# Patient Record
Sex: Female | Born: 1955 | Race: White | Hispanic: No | Marital: Married | Smoking: Never smoker
Health system: Southern US, Community
[De-identification: ages and names within clinical notes are randomized; demographics above are authoritative.]

## PROBLEM LIST (undated history)

## (undated) DIAGNOSIS — C55 Malignant neoplasm of uterus, part unspecified: Secondary | ICD-10-CM

## (undated) DIAGNOSIS — J309 Allergic rhinitis, unspecified: Secondary | ICD-10-CM

## (undated) DIAGNOSIS — C50919 Malignant neoplasm of unspecified site of unspecified female breast: Secondary | ICD-10-CM

## (undated) DIAGNOSIS — Z9889 Other specified postprocedural states: Secondary | ICD-10-CM

## (undated) DIAGNOSIS — Z1501 Genetic susceptibility to malignant neoplasm of breast: Secondary | ICD-10-CM

## (undated) DIAGNOSIS — I890 Lymphedema, not elsewhere classified: Secondary | ICD-10-CM

## (undated) DIAGNOSIS — J45909 Unspecified asthma, uncomplicated: Secondary | ICD-10-CM

## (undated) DIAGNOSIS — O009 Unspecified ectopic pregnancy without intrauterine pregnancy: Secondary | ICD-10-CM

## (undated) DIAGNOSIS — O020 Blighted ovum and nonhydatidiform mole: Secondary | ICD-10-CM

## (undated) DIAGNOSIS — R112 Nausea with vomiting, unspecified: Secondary | ICD-10-CM

## (undated) DIAGNOSIS — C20 Malignant neoplasm of rectum: Secondary | ICD-10-CM

## (undated) HISTORY — DX: Allergic rhinitis, unspecified: J30.9

## (undated) HISTORY — PX: ECTOPIC PREGNANCY SURGERY: SHX613

## (undated) HISTORY — DX: Malignant neoplasm of uterus, part unspecified: C55

## (undated) HISTORY — PX: VESICOVAGINAL FISTULA CLOSURE W/ TAH: SUR271

## (undated) HISTORY — PX: TONSILLECTOMY: SUR1361

## (undated) HISTORY — DX: Malignant neoplasm of unspecified site of unspecified female breast: C50.919

## (undated) HISTORY — DX: Unspecified asthma, uncomplicated: J45.909

## (undated) HISTORY — DX: Lymphedema, not elsewhere classified: I89.0

## (undated) HISTORY — PX: TOTAL ABDOMINAL HYSTERECTOMY: SHX209

## (undated) HISTORY — DX: Malignant neoplasm of uterus, part unspecified (CMS-HCC): C55

## (undated) HISTORY — PX: OTHER PROCEDURE: U1053

## (undated) HISTORY — DX: Unspecified ectopic pregnancy without intrauterine pregnancy: O00.90

## (undated) HISTORY — DX: Blighted ovum and nonhydatidiform mole: O02.0

## (undated) HISTORY — DX: Other specified postprocedural states: R11.2

## (undated) HISTORY — PX: TONSILLECTOMY AND ADENOIDECTOMY: SHX2683

## (undated) HISTORY — PX: BREAST SURGERY: SHX581

## (undated) HISTORY — DX: Malignant neoplasm of unspecified site of unspecified female breast (CMS-HCC): C50.919

## (undated) HISTORY — DX: Other specified postprocedural states: Z98.890

## (undated) MED ORDER — ALBUTEROL SULFATE 108 (90 BASE) MCG/ACT IN AERS
INHALATION_SPRAY | RESPIRATORY_TRACT | Status: AC
Start: 2019-03-04 — End: ?

## (undated) MED ORDER — OMEPRAZOLE 20 MG OR CPDR
DELAYED_RELEASE_CAPSULE | ORAL | 1 refills | Status: AC
Start: 2018-09-18 — End: ?

## (undated) MED ORDER — IBUPROFEN 800 MG OR TABS
800.00 mg | ORAL_TABLET | Freq: Four times a day (QID) | ORAL | Status: AC | PRN
Start: 2018-05-01 — End: ?

---
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
## 1982-05-28 HISTORY — PX: BREAST LUMPECTOMY: SHX2

## 1993-05-28 HISTORY — PX: BREAST SURGERY: SHX581

## 1993-05-28 HISTORY — PX: MASTECTOMY: SHX3

## 1996-05-28 HISTORY — PX: TOTAL ABDOMINAL HYSTERECTOMY: SHX209

## 1996-05-28 HISTORY — PX: TOTAL ABDOMINAL HYSTERECTOMY W/ BILATERAL SALPINGOOPHORECTOMY: SHX83

## 2000-07-12 ENCOUNTER — Other Ambulatory Visit: Admission: RE | Admit: 2000-07-12 | Discharge: 2000-07-12 | Payer: Self-pay | Admitting: *Deleted

## 2000-10-08 ENCOUNTER — Ambulatory Visit (HOSPITAL_COMMUNITY): Admission: RE | Admit: 2000-10-08 | Discharge: 2000-10-08 | Payer: Self-pay | Admitting: Oncology

## 2000-10-08 ENCOUNTER — Encounter: Payer: Self-pay | Admitting: Oncology

## 2001-04-07 ENCOUNTER — Encounter: Admission: RE | Admit: 2001-04-07 | Discharge: 2001-04-07 | Payer: Self-pay | Admitting: General Surgery

## 2001-04-17 ENCOUNTER — Ambulatory Visit (HOSPITAL_COMMUNITY): Admission: RE | Admit: 2001-04-17 | Discharge: 2001-04-17 | Payer: Self-pay | Admitting: General Surgery

## 2001-04-17 ENCOUNTER — Encounter: Payer: Self-pay | Admitting: General Surgery

## 2001-06-19 ENCOUNTER — Other Ambulatory Visit: Admission: RE | Admit: 2001-06-19 | Discharge: 2001-06-19 | Payer: Self-pay | Admitting: *Deleted

## 2002-06-29 ENCOUNTER — Other Ambulatory Visit: Admission: RE | Admit: 2002-06-29 | Discharge: 2002-06-29 | Payer: Self-pay | Admitting: *Deleted

## 2004-05-02 ENCOUNTER — Ambulatory Visit: Payer: Self-pay | Admitting: Oncology

## 2004-10-31 ENCOUNTER — Ambulatory Visit: Payer: Self-pay | Admitting: Oncology

## 2004-12-11 ENCOUNTER — Ambulatory Visit: Payer: Self-pay | Admitting: Oncology

## 2005-06-08 ENCOUNTER — Ambulatory Visit: Payer: Self-pay | Admitting: Oncology

## 2005-06-19 ENCOUNTER — Encounter (INDEPENDENT_AMBULATORY_CARE_PROVIDER_SITE_OTHER): Payer: Self-pay | Admitting: *Deleted

## 2005-06-19 ENCOUNTER — Encounter: Admission: RE | Admit: 2005-06-19 | Discharge: 2005-06-19 | Payer: Self-pay | Admitting: General Surgery

## 2005-07-16 ENCOUNTER — Encounter: Admission: RE | Admit: 2005-07-16 | Discharge: 2005-07-16 | Payer: Self-pay | Admitting: General Surgery

## 2005-07-16 ENCOUNTER — Encounter (INDEPENDENT_AMBULATORY_CARE_PROVIDER_SITE_OTHER): Payer: Self-pay | Admitting: *Deleted

## 2005-07-16 ENCOUNTER — Ambulatory Visit (HOSPITAL_BASED_OUTPATIENT_CLINIC_OR_DEPARTMENT_OTHER): Admission: RE | Admit: 2005-07-16 | Discharge: 2005-07-16 | Payer: Self-pay | Admitting: General Surgery

## 2005-12-18 ENCOUNTER — Encounter: Admission: RE | Admit: 2005-12-18 | Discharge: 2005-12-18 | Payer: Self-pay | Admitting: Oncology

## 2007-09-26 ENCOUNTER — Ambulatory Visit: Payer: Self-pay | Admitting: Internal Medicine

## 2007-09-26 DIAGNOSIS — J45909 Unspecified asthma, uncomplicated: Secondary | ICD-10-CM | POA: Insufficient documentation

## 2007-09-26 DIAGNOSIS — Z8542 Personal history of malignant neoplasm of other parts of uterus: Secondary | ICD-10-CM | POA: Insufficient documentation

## 2007-09-26 DIAGNOSIS — C50919 Malignant neoplasm of unspecified site of unspecified female breast: Secondary | ICD-10-CM | POA: Insufficient documentation

## 2007-11-03 ENCOUNTER — Ambulatory Visit: Payer: Self-pay | Admitting: Internal Medicine

## 2008-03-30 ENCOUNTER — Telehealth (INDEPENDENT_AMBULATORY_CARE_PROVIDER_SITE_OTHER): Payer: Self-pay | Admitting: *Deleted

## 2008-04-29 ENCOUNTER — Ambulatory Visit: Payer: Self-pay | Admitting: Internal Medicine

## 2008-04-29 DIAGNOSIS — R609 Edema, unspecified: Secondary | ICD-10-CM | POA: Insufficient documentation

## 2008-10-04 ENCOUNTER — Telehealth (INDEPENDENT_AMBULATORY_CARE_PROVIDER_SITE_OTHER): Payer: Self-pay | Admitting: *Deleted

## 2008-12-01 ENCOUNTER — Ambulatory Visit: Payer: Self-pay | Admitting: Internal Medicine

## 2009-09-09 ENCOUNTER — Ambulatory Visit: Payer: Self-pay | Admitting: Internal Medicine

## 2009-09-09 DIAGNOSIS — J310 Chronic rhinitis: Secondary | ICD-10-CM | POA: Insufficient documentation

## 2009-09-15 ENCOUNTER — Telehealth (INDEPENDENT_AMBULATORY_CARE_PROVIDER_SITE_OTHER): Payer: Self-pay | Admitting: *Deleted

## 2009-09-16 ENCOUNTER — Encounter: Admission: RE | Admit: 2009-09-16 | Discharge: 2009-09-16 | Payer: Self-pay | Admitting: Internal Medicine

## 2010-01-11 ENCOUNTER — Encounter (INDEPENDENT_AMBULATORY_CARE_PROVIDER_SITE_OTHER): Payer: Self-pay | Admitting: *Deleted

## 2010-02-09 ENCOUNTER — Encounter (INDEPENDENT_AMBULATORY_CARE_PROVIDER_SITE_OTHER): Payer: Self-pay | Admitting: *Deleted

## 2010-02-13 ENCOUNTER — Ambulatory Visit: Payer: Self-pay | Admitting: Internal Medicine

## 2010-03-03 ENCOUNTER — Telehealth (INDEPENDENT_AMBULATORY_CARE_PROVIDER_SITE_OTHER): Payer: Self-pay | Admitting: *Deleted

## 2010-03-07 ENCOUNTER — Ambulatory Visit: Payer: Self-pay | Admitting: Internal Medicine

## 2010-03-09 ENCOUNTER — Encounter: Payer: Self-pay | Admitting: Internal Medicine

## 2010-04-07 ENCOUNTER — Ambulatory Visit: Payer: Self-pay | Admitting: Oncology

## 2010-06-27 NOTE — Miscellaneous (Signed)
Summary: previsit prep/rm  Clinical Lists Changes  Medications: Added new medication of MOVIPREP 100 GM  SOLR (PEG-KCL-NACL-NASULF-NA ASC-C) As per prep instructions. - Signed Rx of MOVIPREP 100 GM  SOLR (PEG-KCL-NACL-NASULF-NA ASC-C) As per prep instructions.;  #1 x 0;  Signed;  Entered by: Sherren Kerns RN;  Authorized by: Hilarie Fredrickson MD;  Method used: [facility] to [facility] [phone]*, [address] [place], [place], [place]  [address], Ph: [phone] or [phone], Fax: [phone] Observations: Added new observation of ALLERGY REV: Done (02/13/2010 12:37)    Prescriptions: MOVIPREP 100 GM  SOLR (PEG-KCL-NACL-NASULF-NA ASC-C) As per prep instructions.  #1 x 0   Entered by:   Sherren Kerns RN   Authorized by:   Hilarie Fredrickson MD   Signed by:   Sherren Kerns RN on 02/13/2010   Method used:   Electronically to        [facility] [phone]* (retail)       [address]       [place], [place]  [address]       Ph: [phone] or [phone]       Fax: [phone]   RxID:   [id number]

## 2010-06-27 NOTE — Assessment & Plan Note (Signed)
Summary: Pulmonary/ flare with rhinitis vs sinusitis   Primary Provider/Referring Provider:  Belva Agee  CC:  Acute visit.  Pt c/o wheezing , nasal congestion , and wagtery eyes and increased SOB x 4 wks.  She was txed with zpack and already finished this about 2 wks ago. Marland Kitchen  History of Present Illness: 1 yowf never smoked allergies and asthma more sinus than lower resp symptoms changed for the worse sev months ago after rx with acutane ( whereas prev on albuterol tablets and intal and based on prev allergy instructions avoid of animals and cig smoke and doing "fine").  Then in March 09 more wheezing assoc with runny nose and sneezing rx with benadryl  and got steroid shot no benefit then steroid pak that helped but worse when finished. worse with advair and no better with allegra or zyrtec.  Seen May 1 rx symbicort and as needed chlortrimeton and 100% back to nl.   11/03/07 doing fine @   80 two bid and rec step down after 3 mo  April 29, 2008 reduced dose on her own  to one twice daily win several weeks of ov and has had to use albuterol pills 2-3 times since then once in response to cat but overall doing fine. tors.    Main co is feeling puffy in ext at end of day, admits not careful with salt, wonders if it's the ics.  rx was symbicort 80 did not do well on qvar  December 01, 2008 ov for refill of symbicort 80 uses 2 qam and none at night, doing great no cough or sob. rec f/u as needed  September 09, 2009 Acute visit.  Pt c/o wheezing , nasal congestion , watery eyes and increased SOB x 4 wks.  She was txed with zpack and already finished this about 2 wks ago.  increased need for ventolin but not while sleeping and wakes up stuffy nose but no purulent secrections.  Pt denies any significant sore throat, dysphagia, itching, sneezing,    fever, chills, sweats, unintended wt loss, pleuritic or exertional cp, hempoptysis, change in activity tolerance  orthopnea pnd or leg swelling. Pt also denies any  obvious fluctuation in symptoms with weather or environmental change or other alleviating or aggravating factors.           Current Medications (verified): 1)  Symbicort 80-4.5 Mcg/act  Aero (Budesonide-Formoterol Fumarate) .... 2 Puffs First Thing  in Am and 2 Puffs Again in Pm About 12 Hours Later 2)  Ventolin Hfa 108 (90 Base) Mcg/act  Aers (Albuterol Sulfate) .Marland Kitchen.. 1-2 Puffs Every 4-6 Hours As Needed 3)  Flexeril 10 Mg  Tabs (Cyclobenzaprine Hcl) .... As Needed 4)  Maxzide-25 37.5-25 Mg Tabs (Triamterene-Hctz) .... One Daily As Needed For Swelling 5)  Diphenhydramine Hcl 50 Mg Caps (Diphenhydramine Hcl) .Marland Kitchen.. 1 Every 6 Hours As Needed 6)  Visine-A 0.025-0.3 % Soln (Naphazoline-Pheniramine) .... As Directed  Allergies (verified): 1)  ! Morphine 2)  Codeine  Past History:  Past Medical History: Asthma and allergies    - HFA 75% December 01, 2008  Breast Cancer, 1984 limited surgery, recurrence 1995 mastectomy and chemo no rt Uterine Cancer hysterectomy  Chronic rhinitis     - CRF reviewed September 09, 2009   Vital Signs:  Patient profile:   55 year old female O2 Sat:      99 % on Room air Temp:     98.2 degrees F oral Pulse rate:  89 / minute BP sitting:   120 / 92  (left arm)  Vitals Entered By: Vernie Murders (September 09, 2009 10:44 AM)  O2 Flow:  Room air  Physical Exam  Additional Exam:  pleasant ambulatory  white female in no acute distress. HEENT: nl dentition,  and orophanx. Severe bilateral turbinate edema/erythema. Nl external ear canals without cough reflex Neck without JVD/Nodes/TM Lungs clear to A and P bilaterally without cough on insp or exp maneuvers. RRR no s3 or murmur or increase in P2  trace edema both ankles Abd soft and benign with nl excursion in the supine position. No bruits or organomegaly Ext warm without calf tenderness, cyanosis clubbing  Skin warm and dry without lesions     Impression & Recommendations:  Problem # 1:  ASTHMA  (ICD-493.90)  DDX of  difficult airways managment all start with A and  include Adherence, Ace Inhibitors, Acid Reflux, Active Sinus Disease, Alpha 1 Antitripsin deficiency, Anxiety masquerading as Airways dz,  ABPA,  allergy(esp in young), Aspiration (esp in elderly), Adverse effects of DPI,  Active smokers, plus one B  = Beta blocker use..    Adherence seems quite good,  hfa technique acceptable  Active sinus dz appears to be the problem here, if not improving on rx consider sinus ct   Each maintenance medication was reviewed in detail including most importantly the difference between maintenance and as needed and under what circumstances the prns are to be used. See instructions for specific recommendations   I discussed the importance of understanding the goals of asthma therapy including the rule of 2's as it applies to the use of a rescue inhaler.   Problem # 2:  CHRONIC RHINITIS (ICD-472.0)  I reviewed both graphic and text formatted material regarding the diagnosis and management of rhinitis including how to use topical steroids effectively and why it is necessary to treat nasal obstruction and inflammation and infection allat  the same time to eradicate the cycle of inflammation causing obstruction causing an infection causing inflammation   Orders: Est. Patient Level IV ([address])  Medications Added to Medication List This Visit: 1)  Diphenhydramine Hcl 50 Mg Caps (Diphenhydramine hcl) .Marland Kitchen.. 1 every 6 hours as needed 2)  Visine-a 0.025-0.3 % Soln (Naphazoline-pheniramine) .... As directed 3)  Prednisone 10 Mg Tabs (Prednisone) .... 4 each am x 2days, 2x2days, 1x2days and stop 4)  Augmentin 875-125 Mg Tabs (Amoxicillin-pot clavulanate) .... By mouth twice daily  Patient Instructions: 1)  pseudophed for congestion 2)  Prednisone x 6 days 3)  Augmentin 875 twice daily x 10days 4)  Work on perfecting inhaler technique:  relax and blow all the way out then take a nice smooth deep  breath back in, triggering the inhaler at same time you start breathing in and hold few secs and out thru nose 5)  Return if not 100% back to normal Prescriptions: AUGMENTIN 875-125 MG  TABS (AMOXICILLIN-POT CLAVULANATE) By mouth twice daily  #20 x 0   Entered and Authorized by:   Nyoka Cowden MD   Signed by:   Nyoka Cowden MD on 09/09/2009   Method used:   Electronically to        [facility] [phone]* (retail)       [address]       [place], [place]  [address]       Ph: [phone] or [phone]  Fax: [phone]   RxID:   [id number] PREDNISONE 10 MG  TABS (PREDNISONE) 4 each am x 2days, 2x2days, 1x2days and stop  #14 x 0   Entered and Authorized by:   Nyoka Cowden MD   Signed by:   Nyoka Cowden MD on 09/09/2009   Method used:   Electronically to        [facility] [phone]* (retail)       [address]       [place], [place]  [address]       Ph: [phone] or [phone]       Fax: [phone]   RxID:   [id number]

## 2010-06-27 NOTE — Procedures (Signed)
Summary: Colonoscopy  Patient: Terri Daniels Note: All result statuses are Final unless otherwise noted.  Tests: (1) Colonoscopy (COL)   COL Colonoscopy           DONE     [facility]     [address]     [place], [place]  [address]           COLONOSCOPY PROCEDURE REPORT           PATIENT:  Terri Daniels, Terri Daniels  MR#:  [id number]     BIRTHDATE:  14-Dec-1955, 54 yrs. old  GENDER:  female     ENDOSCOPIST:  Wilhemina Bonito. Eda Keys, MD     REF. BY:  Belva Agee, NP     PROCEDURE DATE:  03/07/2010     PROCEDURE:  Colonoscopy with snare polypectomy x 5     ASA CLASS:  Class II     INDICATIONS:  Routine Risk Screening     MEDICATIONS:   Fentanyl 100 mcg IV, Versed 10 mg IV, Benadryl 50     mg IV           DESCRIPTION OF PROCEDURE:   After the risks benefits and     alternatives of the procedure were thoroughly explained, informed     consent was obtained.  Digital rectal exam was performed and     revealed no abnormalities.   The LB CF-[id number] endoscope     was introduced through the anus and advanced to the cecum, which     was identified by both the appendix and ileocecal valve, without     limitations.Time to cecum = 5:17 min. The quality of the prep was     excellent, using MoviPrep.  The instrument was then slowly     withdrawn (time = 22:35 min) as the colon was fully examined.     <<PROCEDUREIMAGES>>           FINDINGS:  Five polyps were found, all < 5 mm. these were located     in the cecum, ascending (3), and tansverse colon. Polyps were     snared without cautery. Retrieval was successful.   Moderate     diverticulosis was found in the left colon.  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   Retroflexed views in the rectum     revealed no abnormalities.    The scope was then withdrawn from     the patient and the procedure completed.           COMPLICATIONS:  None     ENDOSCOPIC IMPRESSION:     1) Five polyps - removed     2) Moderate diverticulosis in  the left colon     3) Melanosis throughout the colon           RECOMMENDATIONS:     1) Follow up colonoscopy in 3 or 5 years (pending pathology     results)           ______________________________     Wilhemina Bonito. Eda Keys, MD           CC:  Belva Agee, NP;  The Patient           n.     eSIGNED:   John N. Eda Keys at 03/07/2010 12:12 PM           [place], [place], [phone]  Note: An exclamation mark (!) indicates a result that was not dispersed into  the flowsheet. Document Creation Date: 03/07/2010 12:12 PM _______________________________________________________________________  (1) Order result status: Final Collection or observation date-time: 03/07/2010 12:01 Requested date-time:  Receipt date-time:  Reported date-time:  Referring Physician:   Ordering Physician: Fransico Setters [phone]) Specimen Source:  Source: Launa Grill Order Number: [id number] Lab site:   Appended Document: Colonoscopy     Procedures Next Due Date:    Colonoscopy: 02/2013

## 2010-06-27 NOTE — Progress Notes (Signed)
Summary: prep ?'s  Phone Note Call from Patient Call back at Home Phone [phone]   Caller: Patient Call For: Dr. Marina Goodell Reason for Call: Talk to Nurse Summary of Call: prep ?'s Initial call taken by: Vallarie Mare,  March 03, 2010 12:28 PM  Follow-up for Phone Call        Answered questions re: foods allowed Follow-up by: Wyona Almas RN,  March 03, 2010 1:38 PM

## 2010-06-27 NOTE — Letter (Signed)
Summary: Patient Notice- Polyp Results  [facility]  [address] [place], [place] [address]   Phone: [phone]  Fax: [phone]        March 09, 2010 MRN: [id number]    [facility] [address] [place], [place]  [address]    Dear Ms. Dresser,  I am pleased to inform you that the colon polyp(s) removed during your recent colonoscopy was (were) found to be benign (no cancer detected) upon pathologic examination.  I recommend you have a repeat colonoscopy examination in 3 years to look for recurrent polyps, as having colon polyps increases your risk for having recurrent polyps or even colon cancer in the future.  Should you develop new or worsening symptoms of abdominal pain, bowel habit changes or bleeding from the rectum or bowels, please schedule an evaluation with either your primary care physician or with me.     Additional information/recommendations:  __ No further action with gastroenterology is needed at this time. Please      follow-up with your primary care physician for your other healthcare      needs.    Please call us if you are having persistent problems or have questions about your condition that have not been fully answered at this time.  Sincerely,  Hilarie Fredrickson MD  This letter has been electronically signed by your physician.  Appended Document: Patient Notice- Polyp Results letter mailed

## 2010-06-27 NOTE — Letter (Signed)
Summary: [facility] Instructions  [facility]  [address] [place], [place] [address]   Phone: [phone]  Fax: [phone]       Terri Daniels    1955/11/29    MRN: [id number]        Procedure Day /Date:  Tuesday 03/07/2010     Arrival Time: 9:30 am     Procedure Time: 10:30 am     Location of Procedure:                    _x _  [facility] (4th Floor)                        PREPARATION FOR COLONOSCOPY WITH MOVIPREP   Starting 5 days prior to your procedure Thursday 10/6 do not eat nuts, seeds, popcorn, corn, beans, peas,  salads, or any raw vegetables.  Do not take any fiber supplements (e.g. Metamucil, Citrucel, and Benefiber).  THE DAY BEFORE YOUR PROCEDURE         DATE: Monday 10/10  1.  Drink clear liquids the entire day-NO SOLID FOOD  2.  Do not drink anything colored red or purple.  Avoid juices with pulp.  No orange juice.  3.  Drink at least 64 oz. (8 glasses) of fluid/clear liquids during the day to prevent dehydration and help the prep work efficiently.  CLEAR LIQUIDS INCLUDE: Water Jello Ice Popsicles Tea (sugar ok, no milk/cream) Powdered fruit flavored drinks Coffee (sugar ok, no milk/cream) Gatorade Juice: apple, white grape, white cranberry  Lemonade Clear bullion, consomm, broth Carbonated beverages (any kind) Strained chicken noodle soup Hard Candy                             4.  In the morning, mix first dose of MoviPrep solution:    Empty 1 Pouch A and 1 Pouch B into the disposable container    Add lukewarm drinking water to the top line of the container. Mix to dissolve    Refrigerate (mixed solution should be used within 24 hrs)  5.  Begin drinking the prep at 5:00 p.m. The MoviPrep container is divided by 4 marks.   Every 15 minutes drink the solution down to the next mark (approximately 8 oz) until the full liter is complete.   6.  Follow completed prep with 16 oz of clear liquid of your choice  (Nothing red or purple).  Continue to drink clear liquids until bedtime.  7.  Before going to bed, mix second dose of MoviPrep solution:    Empty 1 Pouch A and 1 Pouch B into the disposable container    Add lukewarm drinking water to the top line of the container. Mix to dissolve    Refrigerate  THE DAY OF YOUR PROCEDURE      DATE: Tuesday 10/11  Beginning at 5:30 a.m. (5 hours before procedure):         1. Every 15 minutes, drink the solution down to the next mark (approx 8 oz) until the full liter is complete.  2. Follow completed prep with 16 oz. of clear liquid of your choice.    3. You may drink clear liquids until 8:30 am (2 HOURS BEFORE PROCEDURE).   MEDICATION INSTRUCTIONS  Unless otherwise instructed, you should take regular prescription medications with a small sip of water   as early as possible the morning of your  procedure.    Additional medication instructions: n/a         OTHER INSTRUCTIONS  You will need a responsible adult at least 55 years of age to accompany you and drive you home.   This person must remain in the waiting room during your procedure.  Wear loose fitting clothing that is easily removed.  Leave jewelry and other valuables at home.  However, you may wish to bring a book to read or  an iPod/MP3 player to listen to music as you wait for your procedure to start.  Remove all body piercing jewelry and leave at home.  Total time from sign-in until discharge is approximately 2-3 hours.  You should go home directly after your procedure and rest.  You can resume normal activities the  day after your procedure.  The day of your procedure you should not:   Drive   Make legal decisions   Operate machinery   Drink alcohol   Return to work  You will receive specific instructions about eating, activities and medications before you leave.    The above instructions have been reviewed and explained to me by  Sherren Kerns RN  February 13, 2010 1:07 PM     I fully understand and can verbalize these instructions _____________________________ Date _________

## 2010-06-27 NOTE — Letter (Signed)
Summary: Pre Visit Letter Revised  [facility]  [address] [place], [place] [address]   Phone: [phone]  Fax: [phone]    01/11/2010 MRN: [id number]  Terri Daniels [address] [place], [place]  [address]              Procedure Date:  03/07/2010   Welcome to the Gastroenterology Division at [facility].    You are scheduled to see a nurse for your pre-procedure visit on 02/13/2010 at 1:00PM on the 3rd floor at [facility], [address]  We ask that you try to arrive at our office 15 minutes prior to your appointment time to allow for check-in.  Please take a minute to review the attached form.  If you answer "Yes" to one or more of the questions on the first page, we ask that you call the person listed at your earliest opportunity.  If you answer "No" to all of the questions, please complete the rest of the form and bring it to your appointment.    Your nurse visit will consist of discussing your medical and surgical history, your immediate family medical history, and your medications.    If you are unable to list all of your medications on the form, please bring the medication bottles to your appointment and we will list them.  We will need to be aware of both prescribed and over the counter drugs.  We will need to know exact dosage information as well.    Please be prepared to read and sign documents such as consent forms, a financial agreement, and acknowledgement forms.  If necessary, and with your consent, a friend or relative is welcome to sit-in on the nurse visit with you.  Please bring your insurance card so that we may make a copy of it.  If your insurance requires a referral to see a specialist, please bring your referral form from your primary care physician.  No co-pay is required for this nurse visit.     If you cannot keep your appointment, please call [phone] to cancel or reschedule prior to your appointment date.  This  allows [place] the opportunity to schedule an appointment for another patient in need of care.   Thank you for choosing [facility] for your medical needs.  We appreciate the opportunity to care for you.  Please visit [place] at our website  to learn more about our practice.                     Sincerely,  The Gastroenterology Division

## 2010-06-27 NOTE — Progress Notes (Signed)
Summary: still sick > needs sinus ct asap  Phone Note Call from Patient Call back at Home Phone [phone] Call back at [phone]   Caller: Patient Call For: wert Reason for Call: Talk to Nurse Summary of Call: drainage,sore around eye socket left side, voice not back to normal yet.  Still thinks there is something going on.  Antibiotic done in 3 days, finished prednisone.  Please advise. Initial call taken by: Eugene Gavia,  September 15, 2009 2:33 PM  Follow-up for Phone Call        pt is requesting to go ahead with CT of sinus, she states she has improvement in her symptoms, but still feels pressure inher ears and sinuses. Sh ehas 3 days left of abx. I advised her to finishe abx and I will forward message to MW to get recs on ct. please advise. Carron Curie CMA  September 15, 2009 2:45 PM yes, CT needed, placed order today for asap but if condition worsens over weekend will need to go to ER  as our office will be closed Follow-up by: Nyoka Cowden MD,  September 15, 2009 4:18 PM  Additional Follow-up for Phone Call Additional follow up Details #1::        Spoke with pt and advised that sinus ct ordered and advised if worsens needs to go to ER. Pt verbalized understanding. Additional Follow-up by: Vernie Murders,  September 15, 2009 4:27 PM

## 2010-08-31 ENCOUNTER — Telehealth: Payer: Self-pay | Admitting: Internal Medicine

## 2010-08-31 MED ORDER — BUDESONIDE-FORMOTEROL FUMARATE 80-4.5 MCG/ACT IN AERO: 2.0000 | INHALATION_SPRAY | Freq: Two times a day (BID) | RESPIRATORY_TRACT | Status: DC

## 2010-08-31 NOTE — Telephone Encounter (Signed)
Rx was refilled x 1 only- LMOM for pt to be aware that this was done and advised needs to keep appt for future rx.

## 2010-09-19 ENCOUNTER — Ambulatory Visit: Payer: Self-pay | Admitting: Internal Medicine

## 2010-09-25 ENCOUNTER — Encounter: Payer: Self-pay | Admitting: Internal Medicine

## 2010-09-26 ENCOUNTER — Encounter: Payer: Self-pay | Admitting: Internal Medicine

## 2010-09-26 ENCOUNTER — Ambulatory Visit (INDEPENDENT_AMBULATORY_CARE_PROVIDER_SITE_OTHER): Payer: BC Managed Care – PPO | Admitting: Internal Medicine

## 2010-09-26 DIAGNOSIS — J45909 Unspecified asthma, uncomplicated: Secondary | ICD-10-CM

## 2010-09-26 MED ORDER — BUDESONIDE-FORMOTEROL FUMARATE 80-4.5 MCG/ACT IN AERO: INHALATION_SPRAY | RESPIRATORY_TRACT | Status: DC

## 2010-09-26 MED ORDER — ALBUTEROL SULFATE HFA 108 (90 BASE) MCG/ACT IN AERS: 2.0000 | INHALATION_SPRAY | Freq: Four times a day (QID) | RESPIRATORY_TRACT | Status: DC | PRN

## 2010-09-26 NOTE — Patient Instructions (Signed)
If your breathing worsens or you need to use your rescue inhaler more than twice weekly or wake up more than twice a month with any respiratory symptoms or require more than two rescue inhalers per year, we need to see you right away because this means we're not controlling the underlying problem (inflammation) adequately.  Rescue inhalers do not control inflammation and overuse can lead to unnecessary and costly consequences.  They can make you feel better temporarily but eventually they will quit working effectively much as sleep aids lead to more insomnia if used regularly.    If you are satisfied with your treatment plan let your doctor know and he/she can either refill your medications or you can return here when your prescription runs out.     If in any way you are not 100% satisfied,  please tell us.  If 100% better, tell your friends!

## 2010-09-26 NOTE — Assessment & Plan Note (Signed)
All goals of chronic asthma control met including optimal function and elimination of symptoms with minimal need for rescue therapy.  Contingencies discussed in full including contacting this office immediately if not controlling the symptoms using the rule of two's.     See instructions for specific recommendations which were reviewed directly with the patient who was given a copy with highlighter outlining the key components.   The proper method of use, as well as anticipated side effects, of this metered-dose inhaler are discussed and demonstrated to the patient. Improved to 75% p coaching

## 2010-09-26 NOTE — Progress Notes (Signed)
  Subjective:    Patient ID: Terri Daniels, female    DOB: 12/02/1955, 55 y.o.   MRN: [id number]  HPI   Primary Provider/Referring Provider: Belva Agee   54 yowf never smoked allergies and asthma more sinus than lower resp symptoms changed for the worse sev months ago after rx with acutane ( whereas prev on albuterol tablets and intal and based on prev allergy instructions avoid of animals and cig smoke and doing "fine"). Then in March 09 more wheezing assoc with runny nose and sneezing rx with benadryl and got steroid shot no benefit then steroid pak that helped but worse when finished. worse with advair and no better with allegra or zyrtec. Seen May 1 rx symbicort and as needed chlortrimeton and 100% back to nl.    April 29, 2008 reduced dose on her own to one twice daily win several weeks of ov and has had to use albuterol pills 2-3 times since then once in response to cat but overall doing fine. tors.  Main co is feeling puffy in ext at end of day, admits not careful with salt, wonders if it's the ics.  rx was symbicort 80 did not do well on qvar   December 01, 2008 ov for refill of symbicort 80 uses 2 qam and none at night, doing great no cough or sob. rec f/u as needed  September 09, 2009 Acute visit. Pt c/o wheezing , nasal congestion , watery eyes and increased SOB x 4 wks. She was txed with zpack and already finished this about 2 wks ago. increased need for ventolin but not while sleeping and wakes up stuffy nose but no purulent secrections.   09/26/2010  Ov/Marcia Lepera cc no cough or sob on symbicort 80 2 puffs each am with rare use of ventolin and rarely uses the second dose of symbicort.  Sleeping ok without nocturnal  or early am exac of resp c/o's or need for noct saba.  Pt denies any significant sore throat, dysphagia, itching, sneezing,  nasal congestion or excess/ purulent secretions,  fever, chills, sweats, unintended wt loss, pleuritic or exertional cp, hempoptysis, orthopnea pnd or leg  swelling.    Also denies any obvious fluctuation of symptoms with weather or environmental changes or other aggravating or alleviating factors.      Past Medical History:  Asthma and allergies  - HFA 75% December 01, 2008 > 90% 09/26/2010  Breast Cancer, 1984 limited surgery, recurrence 1995 mastectomy and chemo no rt  Uterine Cancer > hysterectomy  Chronic rhinitis  - CRF reviewed September 09, 2009              Review of Systems     Objective:   Physical Exam  pleasant ambulatory white female in no acute distress.   HEENT: nl dentition, and orophanx. Severe bilateral turbinate edema/erythema. Nl external ear canals without cough reflex  Neck without JVD/Nodes/TM  Lungs clear to A and P bilaterally without cough on insp or exp maneuvers.  RRR no s3 or murmur or increase in P2 trace edema both ankles  Abd soft and benign with nl excursion in the supine position. No bruits or organomegaly  Ext warm without calf tenderness, cyanosis clubbing  Skin warm and dry without lesions        Assessment & Plan:

## 2010-10-13 NOTE — Op Note (Signed)
Terri Daniels, Terri Daniels             ACCOUNT NO.:  [id number]   MEDICAL RECORD NO.:  [id number]          PATIENT TYPE:  AMB   LOCATION:  [facility]                          FACILITY:  [facility]   PHYSICIAN:  Ollen Gross. Vernell Morgans, M.D. DATE OF BIRTH:  1955-08-17   DATE OF PROCEDURE:  07/16/2005  DATE OF DISCHARGE:  07/16/2005                                 OPERATIVE REPORT   PREOPERATIVE DIAGNOSIS:  Left breast calcifications with history of right  breast cancer.   POSTOP DIAGNOSIS:  Left breast calcifications with history of right breast  cancer.   PROCEDURES:  Left breast needle-localized breast biopsy.   SURGEON:  Dr. Carolynne Edouard.   ANESTHESIA:  General via LMA.   PROCEDURE:  After informed consent was obtained, the patient was brought to  the operating placed in supine position operating table. After adequate  induction of general anesthesia, the patient's left breast was prepped with  Betadine and draped in usual sterile manner.  The wire was entering the  breast laterally. The patient had a previous left breast reduction. She had  a circumareolar scar and the lateral portion of the scar was reopened  sharply with the 15 blade knife.  This incision was carried down through the  skin and into the subcutaneous tissue and fatty tissue of the breast.  The  path of the wire was able to be palpated and a circular mass of tissue  around the path of the wire was taken down sharply with the electrocautery  in a circumferential manner. Once the tissue was taken out around the  circumference of the wire. The mass was oriented with a long stitch being  lateral and short stitch being superior.  A specimen x-ray was taken which  showed the calcifications and clip to be within the specimen. The specimen  sent to pathology for further evaluation. The wound was then irrigated  with  copious amounts of saline and hemostasis was achieved using Bovie  electrocautery. The deep layer of the incision was closed with  interrupted 3-  0 Vicryl stitches and the skin was closed a running 4-0 Monocryl  subcuticular stitch. Benzoin, Steri-Strips and sterile dressings were  applied. The patient tolerated well.  At end of the case all needle, sponge,  instrument counts correct. The patient was then awakened taken recovery room  in stable condition.      Ollen Gross. Vernell Morgans, M.D.  Electronically Signed     PST/MEDQ  D:  07/22/2005  T:  07/23/2005  Job:  [id number]

## 2011-05-17 ENCOUNTER — Encounter: Payer: Self-pay | Admitting: Oncology

## 2011-10-24 ENCOUNTER — Other Ambulatory Visit: Payer: Self-pay | Admitting: Internal Medicine

## 2011-10-24 ENCOUNTER — Telehealth: Payer: Self-pay | Admitting: Internal Medicine

## 2011-10-24 MED ORDER — BUDESONIDE-FORMOTEROL FUMARATE 80-4.5 MCG/ACT IN AERO: INHALATION_SPRAY | RESPIRATORY_TRACT | Status: DC

## 2011-10-24 NOTE — Telephone Encounter (Signed)
If you are satisfied with your treatment plan let your doctor know and he/she can either refill your medications or you can return here when your prescription runs out.   I spoke with pt and she scheduled an apt to see MW 11/20/11 at 10:00 for f/u. Rx has been sent to the pharmacy

## 2011-11-20 ENCOUNTER — Ambulatory Visit: Payer: BC Managed Care – PPO | Admitting: Internal Medicine

## 2011-11-21 IMAGING — CT CT PARANASAL SINUSES LIMITED
1 series · 10 of 12 positions shown, 13 images · non-contrast
Comparison: None.

CLINICAL DATA: Sinus pain.  Chronic rhinitis.  Pain in the face.
Medial left orbital soreness.  Left ear pain.

CT PARANASAL SINUS LIMITED WITHOUT CONTRAST
TECHNIQUE: Multidetector CT images of the paranasal sinuses were
obtained in a single plane without contrast.

[Series 3: coronal soft · axial · 0.31mm/px · z∈[+37,+127]mm · 10 of 12 slices shown, 13 images]
[im 2/12  brain]
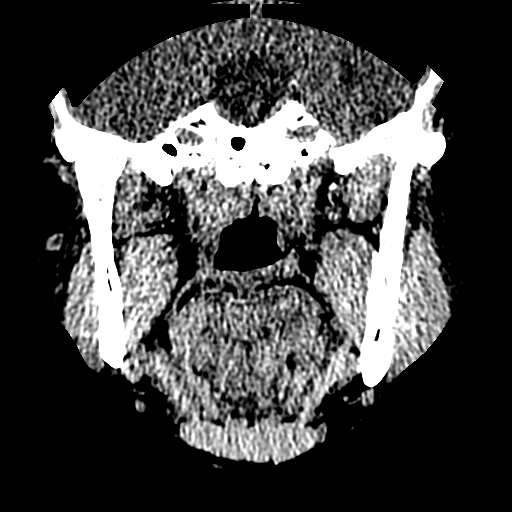
[im 2/12  bone]
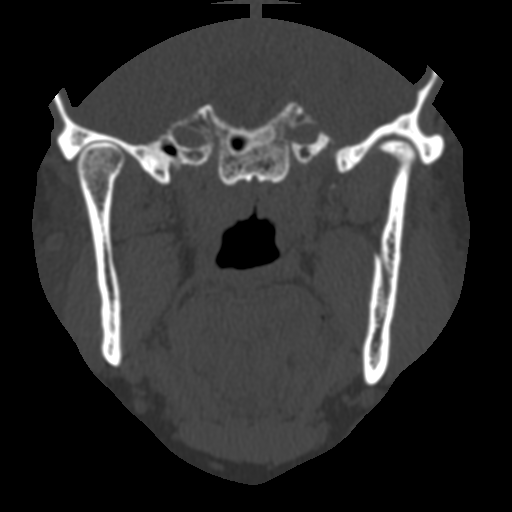
[im 3/12  bone]
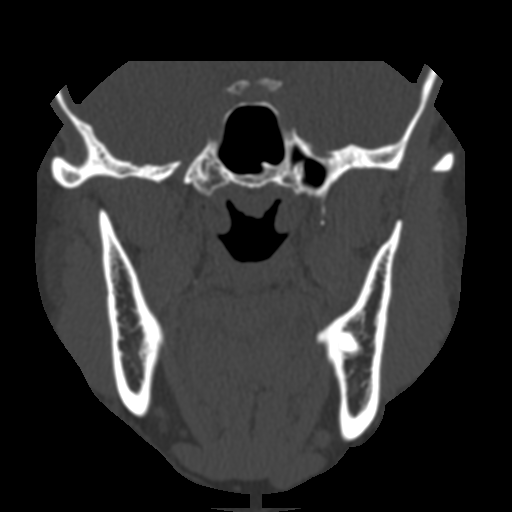
[im 4/12  bone]
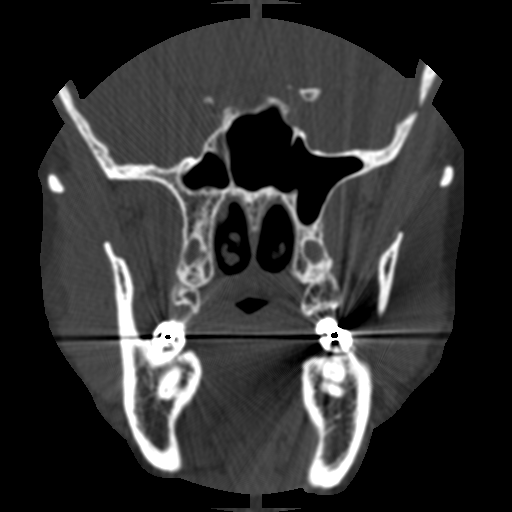
[im 5/12  bone]
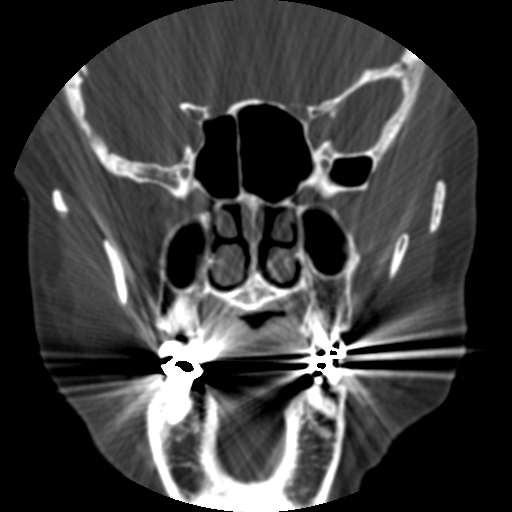
[im 6/12  brain]
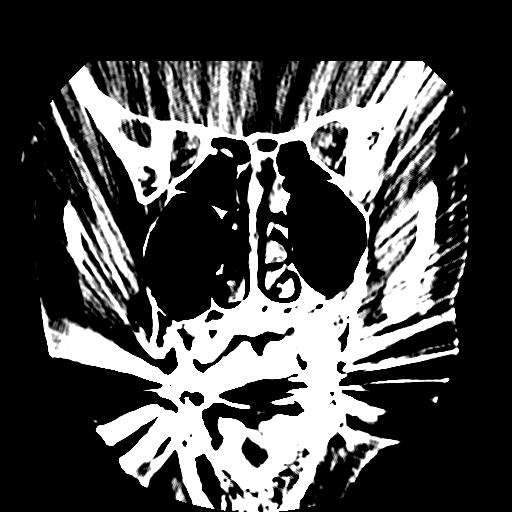
[im 6/12  bone]
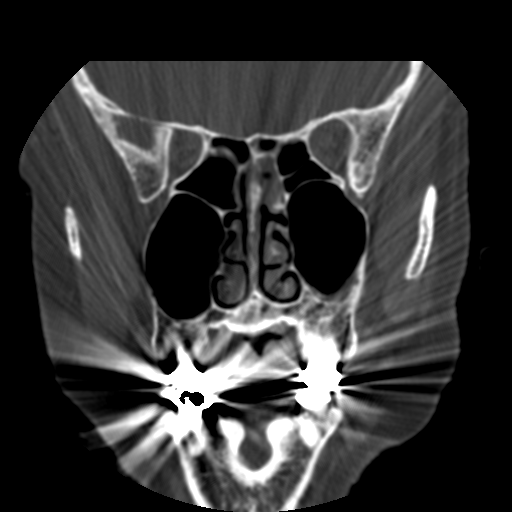
[im 7/12  bone]
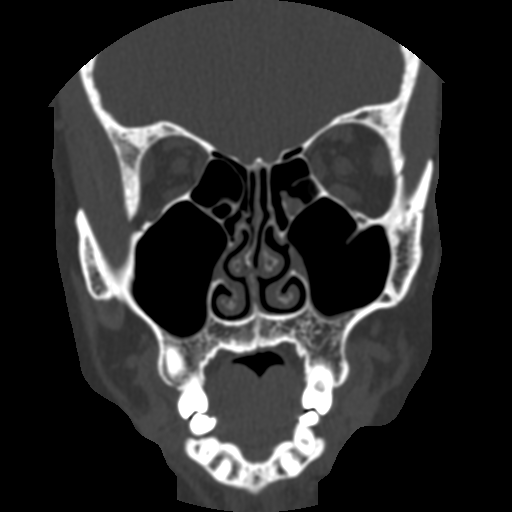
[im 8/12  bone]
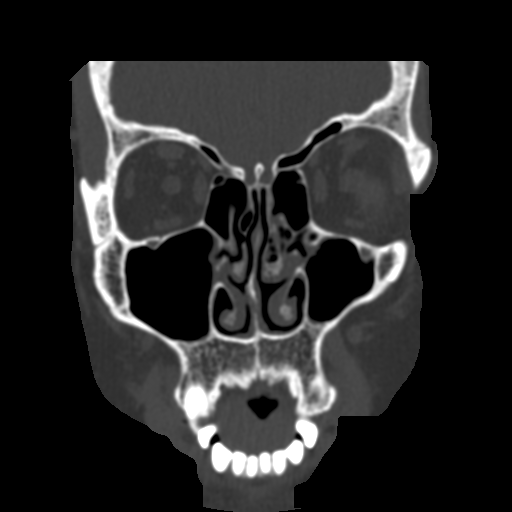
[im 9/12  bone]
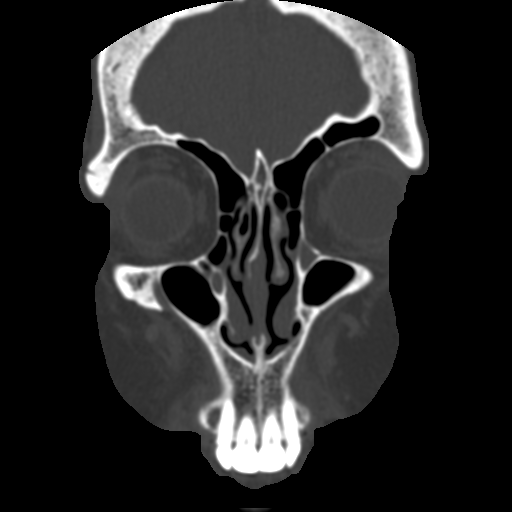
[im 10/12  brain]
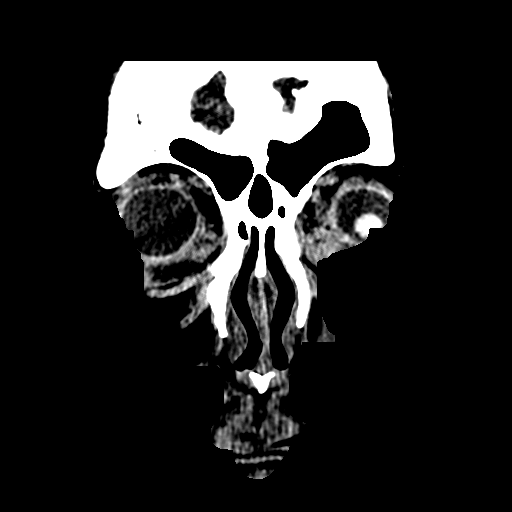
[im 10/12  bone]
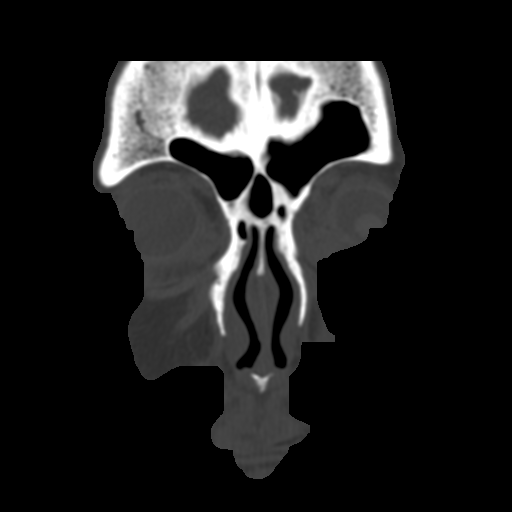
[im 11/12  bone]
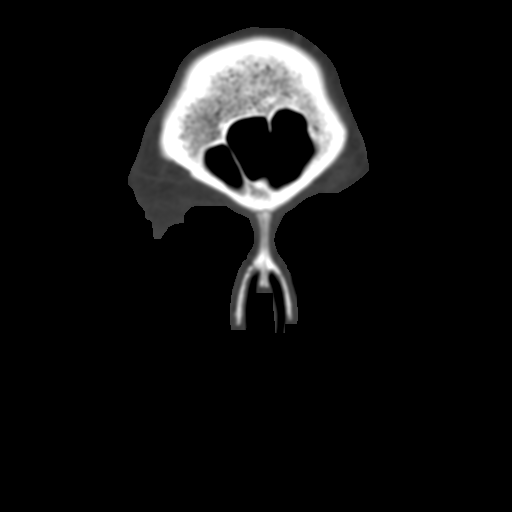

[10 of 12 positions shown; findings below may reference images not displayed]

FINDINGS: There is minimal bilateral maxillary sinus mucosal
thickening.  There is mucosal thickening adjacent to the maxillary
sinus ostia.  Ethmoid sinus mucosal thickening on the right and
mild right-sided sphenoid sinus mucosal thickening.  Frontal sinus
normally aerated.  No air-fluid levels.  Bony sinus walls are
intact. Nasal cavity unremarkable.
IMPRESSION: Negative for acute sinusitis.

Minimal maxillary sinus and ethmoid and sphenoid sinus mucosal
disease.

## 2011-12-20 ENCOUNTER — Ambulatory Visit (INDEPENDENT_AMBULATORY_CARE_PROVIDER_SITE_OTHER): Payer: BC Managed Care – PPO | Admitting: Internal Medicine

## 2011-12-20 ENCOUNTER — Encounter: Payer: Self-pay | Admitting: Internal Medicine

## 2011-12-20 VITALS — BP 130/86 | HR 85 | Temp 98.8°F

## 2011-12-20 DIAGNOSIS — J45909 Unspecified asthma, uncomplicated: Secondary | ICD-10-CM

## 2011-12-20 MED ORDER — BUDESONIDE-FORMOTEROL FUMARATE 80-4.5 MCG/ACT IN AERO: INHALATION_SPRAY | RESPIRATORY_TRACT | Status: DC

## 2011-12-20 NOTE — Progress Notes (Signed)
Subjective:    Patient ID: Terri Daniels, female    DOB: [birth year]    MRN: [id number]  HPI   Primary Provider/Referring Provider: Belva Agee   56 yowf never smoked allergies and asthma more sinus than lower resp symptoms changed for the worse sev months ago after rx with acutane ( whereas prev on albuterol tablets and intal and based on prev allergy instructions avoid of animals and cig smoke and doing "fine"). Then in March 09 more wheezing assoc with runny nose and sneezing rx with benadryl and got steroid shot no benefit then steroid pak that helped but worse when finished. worse with advair and no better with allegra or zyrtec. Seen May 1 rx symbicort and as needed chlortrimeton and 100% back to nl.    April 29, 2008 reduced dose on her own to one twice daily win several weeks of ov and has had to use albuterol pills 2-3 times since then once in response to cat but overall doing fine. tors.  Main co is feeling puffy in ext at end of day, admits not careful with salt, wonders if it's the ics.  rx was symbicort 80 2bid as did not do well on qvar    09/26/2010  Ov/[name] cc no cough or sob on symbicort 80 2 puffs each am with rare use of ventolin and rarely uses the second dose of symbicort.  Sleeping ok without nocturnal  or early am exac of resp c/o's or need for noct saba. rec Rule of 2s reviewed  F/u prn for refills     12/20/2011 f/u ov/[name] maintaining symbicort 80 2bid with good control until 2 weeks prior to OV when needed increase rescue for a few weeks in setting of uri with prominent nasal symptoms and now back normal= not limited by sob, no need for daytime saba,  No unusual cough, purulent sputum or sinus/hb symptoms on present rx.  Sleeping ok without nocturnal  or early am exacerbation  of respiratory  c/o's or need for noct saba. Also denies any obvious fluctuation of symptoms with weather or environmental changes or other aggravating or alleviating factors except as  outlined above   ROS  The following are not active complaints unless bolded sore throat, dysphagia, dental problems, itching, sneezing,  nasal congestion or excess/ purulent secretions, ear ache,   fever, chills, sweats, unintended wt loss, pleuritic or exertional cp, hemoptysis,  orthopnea pnd or leg swelling, presyncope, palpitations, heartburn, abdominal pain, anorexia, nausea, vomiting, diarrhea  or change in bowel or urinary habits, change in stools or urine, dysuria,hematuria,  rash, arthralgias, visual complaints, headache, numbness weakness or ataxia or problems with walking or coordination,  change in mood/affect or memory.        Past Medical History:  Asthma and allergies  - HFA 75% December 01, 2008 > 90% 09/26/2010  Breast Cancer, 1984 limited surgery, recurrence 1995 mastectomy and chemo no rt  Uterine Cancer > hysterectomy  Chronic rhinitis  - CRF reviewed September 09, 2009                   Objective:   Physical Exam  pleasant ambulatory white female in no acute distress.   HEENT: nl dentition, and orophanx. Severe bilateral turbinate edema/erythema. Nl external ear canals without cough reflex  Neck without JVD/Nodes/TM  Lungs clear to A and P bilaterally without cough on insp or exp maneuvers.  RRR no s3 or murmur or increase in P2 trace edema both ankles  Abd soft  and benign with nl excursion in the supine position. No bruits or organomegaly  Ext warm without calf tenderness, cyanosis clubbing  Skin warm and dry without lesions        Assessment & Plan:

## 2011-12-20 NOTE — Assessment & Plan Note (Signed)
I had an extended discussion with the patient today lasting 15 to 20 minutes of a 25 minute visit on the following issues:  All goals of chronic asthma control met including optimal function and elimination of symptoms with minimal need for rescue therapy.  Contingencies discussed in full including contacting this office immediately if not controlling the symptoms using the rule of two's.     She did great for 50 of the last 52 weeks and got thru a typical trigger with prn rescue and now back to baseline good control and absence of saba need.  The proper method of use, as well as anticipated side effects, of a metered-dose inhaler are discussed and demonstrated to the patient. Improved effectiveness after extensive coaching during this visit to a level of approximately  75%  F/u can be prn

## 2011-12-20 NOTE — Patient Instructions (Signed)
If you are satisfied with your treatment plan let your doctor know and he/she can either refill your medications or you can return here when your prescription runs out.     If in any way you are not 100% satisfied,  please tell us.  If 100% better, tell your friends!  

## 2012-01-25 ENCOUNTER — Other Ambulatory Visit: Payer: Self-pay | Admitting: Internal Medicine

## 2012-01-29 ENCOUNTER — Other Ambulatory Visit: Payer: Self-pay | Admitting: Internal Medicine

## 2012-09-18 ENCOUNTER — Other Ambulatory Visit: Payer: Self-pay | Admitting: *Deleted

## 2012-09-18 MED ORDER — IBUPROFEN 800 MG PO TABS: 800.0000 mg | ORAL_TABLET | Freq: Three times a day (TID) | ORAL | Status: DC | PRN

## 2012-09-18 NOTE — Telephone Encounter (Signed)
LAST OV 10/13. LAST REFILL 03/10/12

## 2012-11-22 ENCOUNTER — Other Ambulatory Visit: Payer: Self-pay | Admitting: Nurse Practitioner

## 2012-11-24 NOTE — Telephone Encounter (Signed)
LAST OV 10/13 

## 2012-12-07 ENCOUNTER — Other Ambulatory Visit: Payer: Self-pay | Admitting: Internal Medicine

## 2012-12-23 ENCOUNTER — Other Ambulatory Visit: Payer: Self-pay | Admitting: Internal Medicine

## 2013-01-12 ENCOUNTER — Other Ambulatory Visit: Payer: Self-pay | Admitting: Internal Medicine

## 2013-01-13 ENCOUNTER — Telehealth: Payer: Self-pay | Admitting: Internal Medicine

## 2013-01-13 MED ORDER — BUDESONIDE-FORMOTEROL FUMARATE 80-4.5 MCG/ACT IN AERO: 2.0000 | INHALATION_SPRAY | Freq: Two times a day (BID) | RESPIRATORY_TRACT | Status: DC

## 2013-01-13 NOTE — Telephone Encounter (Signed)
Pt has not been seen since 11/2011. Advised her that she would need an appointment in order to receive further refills. Appointment has been made for 01/19/13 at 2:30 with MW. 30 day supply will be sent to her pharmacy.

## 2013-01-19 ENCOUNTER — Ambulatory Visit (INDEPENDENT_AMBULATORY_CARE_PROVIDER_SITE_OTHER): Payer: BC Managed Care – PPO | Admitting: Internal Medicine

## 2013-01-19 ENCOUNTER — Encounter: Payer: Self-pay | Admitting: Internal Medicine

## 2013-01-19 VITALS — BP 122/90 | HR 102 | Ht 69.0 in | Wt 176.8 lb

## 2013-01-19 DIAGNOSIS — J45909 Unspecified asthma, uncomplicated: Secondary | ICD-10-CM

## 2013-01-19 MED ORDER — ALBUTEROL SULFATE HFA 108 (90 BASE) MCG/ACT IN AERS: INHALATION_SPRAY | RESPIRATORY_TRACT | Status: AC

## 2013-01-19 MED ORDER — BUDESONIDE-FORMOTEROL FUMARATE 80-4.5 MCG/ACT IN AERO: 2.0000 | INHALATION_SPRAY | Freq: Two times a day (BID) | RESPIRATORY_TRACT | Status: DC

## 2013-01-19 NOTE — Progress Notes (Signed)
  Subjective:    Patient ID: Terri Daniels, female    DOB: 08-28-1955    MRN: [id number]      Primary Provider/Referring Provider: Belva Agee   Brief patient profile:  61  yowf never smoked allergies and asthma more sinus than lower resp symptoms  Then in March 09  . worse with advair and no better with allegra or zyrtec >   rx symbicort and as needed chlortrimeton and 100% back to nl since    HPI   09/26/2010  Ov/Wert cc no cough or sob on symbicort 80 2 puffs each am with rare use of ventolin and rarely uses the second dose of symbicort.  Sleeping ok without nocturnal  or early am exac of resp c/o's or need for noct saba. rec Rule of 2s reviewed  F/u prn for refills     12/20/2011 f/u ov/Wert maintaining symbicort 80 2bid with good control until 2 weeks prior to OV when needed increase rescue for a few weeks in setting of uri with prominent nasal symptoms and now back normal= not limited by sob, no need for daytime saba rec No change   01/19/2013 f/u ov/Wert f/u asthma  Chief Complaint  Patient presents with  . Follow-up    pt reports breathing is doing well-- denies any other concerns at this time  . Medication Refill    symbicort   no cough or limiting sob, no need for saba   Sleeping ok without nocturnal  or early am exacerbation  of respiratory  c/o's or need for noct saba. Also denies any obvious fluctuation of symptoms with weather or environmental changes or other aggravating or alleviating factors except as outlined above   ROS  The following are not active complaints unless bolded sore throat, dysphagia, dental problems, itching, sneezing,  nasal congestion or excess/ purulent secretions, ear ache,   fever, chills, sweats, unintended wt loss, pleuritic or exertional cp, hemoptysis,  orthopnea pnd or leg swelling, presyncope, palpitations, heartburn, abdominal pain, anorexia, nausea, vomiting, diarrhea  or change in bowel or urinary habits, change in stools or urine,  dysuria,hematuria,  rash, arthralgias, visual complaints, headache, numbness weakness or ataxia or problems with walking or coordination,  change in mood/affect or memory.        Past Medical History:  Asthma and allergies  - HFA 75% December 01, 2008 > 90% 09/26/2010  Breast Cancer, 1984 limited surgery, recurrence 1995 mastectomy and chemo no rt  Uterine Cancer > hysterectomy  Chronic rhinitis  - CRF reviewed September 09, 2009                   Objective:   Physical Exam  pleasant ambulatory white female in no acute distress.    Wt Readings from Last 3 Encounters:  01/19/13 176 lb 12.8 oz (80.196 kg)    HEENT: nl dentition, and orophanx. Severe bilateral turbinate edema/erythema. Nl external ear canals without cough reflex  Neck without JVD/Nodes/TM  Lungs clear to A and P bilaterally without cough on insp or exp maneuvers.  RRR no s3 or murmur or increase in P2 trace edema both ankles  Abd soft and benign with nl excursion in the supine position. No bruits or organomegaly  Ext warm without calf tenderness, cyanosis clubbing  Skin warm and dry without lesions        Assessment & Plan:

## 2013-01-19 NOTE — Patient Instructions (Addendum)
Symbicort 80 Take 2 puffs first thing in am and then another 2 puffs about 12 hours later.   Only use your albuterol as a rescue medication to be used if you can't catch your breath by resting or doing a relaxed purse lip breathing pattern. The less you use it, the better it will work when you need it. Ok to use it up to 2 puffs every 4 hours but goal is less than twice weekly  If your breathing worsens or you need to use your rescue inhaler more than twice weekly or wake up more than twice a month with any respiratory symptoms or require more than two rescue inhalers per year, we need to see you right away because this means we're not controlling the underlying problem (inflammation) adequately.  Rescue inhalers do not control inflammation and overuse can lead to unnecessary and costly consequences.  They can make you feel better temporarily but eventually they will quit working effectively much as sleep aids lead to more insomnia if used regularly.    If you are satisfied with your treatment plan let your doctor know and he/she can either refill your medications or you can return here when your prescription runs out.     If in any way you are not 100% satisfied,  please tell us.  If 100% better, tell your friends!

## 2013-01-20 NOTE — Assessment & Plan Note (Signed)
The proper method of use, as well as anticipated side effects, of a metered-dose inhaler are discussed and demonstrated to the patient. Improved effectiveness after extensive coaching during this visit to a level of approximately  90%  All goals of chronic asthma control met including optimal function and elimination of symptoms with minimal need for rescue therapy.  Contingencies discussed in full including contacting this office immediately if not controlling the symptoms using the rule of two's.     See instructions for specific recommendations which were reviewed directly with the patient who was given a copy with highlighter outlining the key components.  

## 2013-02-16 ENCOUNTER — Encounter: Payer: Self-pay | Admitting: Internal Medicine

## 2013-02-27 ENCOUNTER — Ambulatory Visit: Payer: BC Managed Care – PPO | Admitting: Internal Medicine

## 2013-03-16 ENCOUNTER — Encounter: Payer: Self-pay | Admitting: Internal Medicine

## 2013-04-01 ENCOUNTER — Ambulatory Visit (AMBULATORY_SURGERY_CENTER): Payer: Self-pay | Admitting: *Deleted

## 2013-04-01 VITALS — Ht 69.0 in | Wt 181.0 lb

## 2013-04-01 DIAGNOSIS — Z86010 Personal history of colonic polyps: Secondary | ICD-10-CM

## 2013-04-01 MED ORDER — MOVIPREP 100 G PO SOLR: ORAL | Status: DC

## 2013-04-01 NOTE — Progress Notes (Signed)
Patient denies any allergies to eggs or soy. Patient has post-op nausea, "they usually give me Zofran", per patient. Patient does not remember having any problems with previous colonoscopies in the past.

## 2013-04-02 ENCOUNTER — Encounter: Payer: Self-pay | Admitting: Internal Medicine

## 2013-04-20 ENCOUNTER — Other Ambulatory Visit: Payer: Self-pay | Admitting: Nurse Practitioner

## 2013-04-21 ENCOUNTER — Ambulatory Visit (AMBULATORY_SURGERY_CENTER): Payer: BC Managed Care – PPO | Admitting: Internal Medicine

## 2013-04-21 ENCOUNTER — Encounter: Payer: Self-pay | Admitting: Internal Medicine

## 2013-04-21 VITALS — BP 104/49 | HR 70 | Temp 97.6°F | Resp 13 | Ht 69.0 in | Wt 181.0 lb

## 2013-04-21 DIAGNOSIS — Z86010 Personal history of colonic polyps: Secondary | ICD-10-CM

## 2013-04-21 DIAGNOSIS — D126 Benign neoplasm of colon, unspecified: Secondary | ICD-10-CM

## 2013-04-21 MED ORDER — SODIUM CHLORIDE 0.9 % IV SOLN: 500.0000 mL | INTRAVENOUS | Status: DC

## 2013-04-21 NOTE — Patient Instructions (Signed)

## 2013-04-21 NOTE — Progress Notes (Signed)
Called to room to assist during endoscopic procedure.  Patient ID and intended procedure confirmed with present staff. Received instructions for my participation in the procedure from the performing physician.  

## 2013-04-21 NOTE — Progress Notes (Signed)
Lidocaine-40mg IV prior to Propofol InductionPropofol given over incremental dosages 

## 2013-04-21 NOTE — Op Note (Signed)
[facility] [address] [place], [address]   COLONOSCOPY PROCEDURE REPORT  PATIENT: Abbegale, Stehle  MR#: [id number] BIRTHDATE: August 16, 1955 , 57  yrs. old GENDER: Female ENDOSCOPIST: Roxy Cedar, MD REFERRED [id number] Program Recall PROCEDURE DATE:  04/21/2013 PROCEDURE:   Colonoscopy with snare polypectomy x 2 First Screening Colonoscopy - Avg.  risk and is 50 yrs.  old or older - No.  Prior Negative Screening - Now for repeat screening. N/A  History of Adenoma - Now for follow-up colonoscopy & has been > or = to 3 yrs.  Yes hx of adenoma.  Has been 3 or more years since last colonoscopy.  Polyps Removed Today? Yes. ASA CLASS:   Class II INDICATIONS:Patient's personal history of adenomatous colon polyps. Index exam October 2011 (multiple adenomatous polyps) MEDICATIONS: MAC sedation, administered by CRNA and propofol (Diprivan) 350mg  IV  DESCRIPTION OF PROCEDURE:   After the risks benefits and alternatives of the procedure were thoroughly explained, informed consent was obtained.  A digital rectal exam revealed no abnormalities of the rectum.   The LB [id number]  endoscope was introduced through the anus and advanced to the cecum, which was identified by both the appendix and ileocecal valve. No adverse events experienced.   The quality of the prep was good, using MoviPrep  The instrument was then slowly withdrawn as the colon was fully examined.  COLON FINDINGS: Two diminutive polyps were found in the ascending colon and transverse colon.  A polypectomy was performed with a cold snare.  The resection was complete and the polyp tissue was completely retrieved.   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   The colon mucosa was otherwise normal. Retroflexed views revealed no abnormalities. The time to cecum=3 minutes 57 seconds.  Withdrawal time=13 minutes 12 seconds.  The scope was withdrawn and the procedure  completed. COMPLICATIONS: There were no complications.  ENDOSCOPIC IMPRESSION: 1.   Two diminutive polyps were found in the colon; polypectomy was performed with a cold snare 2.   Mild melanosis was found throughout the entire examined colon 3.   The colon mucosa was otherwise normal  RECOMMENDATIONS: 1. Follow up colonoscopy in 5 years   eSigned:  Roxy Cedar, MD 04/21/2013 2:41 PM   cc: Rudi Heap, MD and The Patient

## 2013-04-21 NOTE — Progress Notes (Signed)
No complaints noted in the recovery room. Maw   

## 2013-04-22 ENCOUNTER — Telehealth: Payer: Self-pay | Admitting: *Deleted

## 2013-04-22 NOTE — Telephone Encounter (Signed)
  Follow up Call-  Call back number 04/21/2013  Post procedure Call Back phone  # [phone]  Permission to leave phone message Yes     Patient questions:  Do you have a fever, pain , or abdominal swelling? no Pain Score  0 *  Have you tolerated food without any problems? yes  Have you been able to return to your normal activities? yes  Do you have any questions about your discharge instructions: Diet   no Medications  no Follow up visit  no  Do you have questions or concerns about your Care? no  Actions: * If pain score is 4 or above: No action needed, pain <4.  Pt. Still sleep information obtained from spouse.

## 2013-04-28 ENCOUNTER — Encounter: Payer: Self-pay | Admitting: Internal Medicine

## 2013-08-12 ENCOUNTER — Other Ambulatory Visit: Payer: Self-pay | Admitting: Dermatology

## 2013-09-28 ENCOUNTER — Encounter: Payer: Self-pay | Admitting: Family Medicine

## 2013-09-28 ENCOUNTER — Ambulatory Visit (INDEPENDENT_AMBULATORY_CARE_PROVIDER_SITE_OTHER): Payer: BC Managed Care – PPO | Admitting: Family Medicine

## 2013-09-28 VITALS — BP 130/75 | HR 99 | Temp 98.9°F | Ht 69.0 in | Wt 182.0 lb

## 2013-09-28 DIAGNOSIS — R21 Rash and other nonspecific skin eruption: Secondary | ICD-10-CM

## 2013-09-28 MED ORDER — METHYLPREDNISOLONE ACETATE 80 MG/ML IJ SUSP
80.0000 mg | Freq: Once | INTRAMUSCULAR | Status: AC
  Administered 2013-09-28: 80 mg via INTRAMUSCULAR

## 2013-09-28 MED ORDER — HYDROXYZINE HCL 25 MG PO TABS: 25.0000 mg | ORAL_TABLET | Freq: Three times a day (TID) | ORAL | Status: DC | PRN

## 2013-09-28 NOTE — Progress Notes (Signed)
   Subjective:    Patient ID: Terri Daniels, female    DOB: 22-Nov-1955, 58 y.o.   MRN: [id number]  HPI  This 58 y.o. female presents for evaluation of dermatitis and itching.  Review of Systems No chest pain, SOB, HA, dizziness, vision change, N/V, diarrhea, constipation, dysuria, urinary urgency or frequency, myalgias, arthralgias or rash.     Objective:   Physical Exam  Rash on lower extremities and arms      Assessment & Plan:  Rash and nonspecific skin eruption - Plan: methylPREDNISolone acetate (DEPO-MEDROL) injection 80 mg, hydrOXYzine (ATARAX/VISTARIL) 25 MG tablet  Lysbeth Penner FNP

## 2013-10-22 ENCOUNTER — Telehealth: Payer: Self-pay | Admitting: Nurse Practitioner

## 2013-10-22 NOTE — Telephone Encounter (Signed)
appt given for Monday for a rash that has been going on for weeks

## 2013-10-26 ENCOUNTER — Ambulatory Visit (INDEPENDENT_AMBULATORY_CARE_PROVIDER_SITE_OTHER): Payer: BC Managed Care – PPO | Admitting: Nurse Practitioner

## 2013-10-26 ENCOUNTER — Encounter: Payer: Self-pay | Admitting: Nurse Practitioner

## 2013-10-26 VITALS — BP 124/77 | HR 86 | Temp 97.9°F | Ht 69.0 in | Wt 184.0 lb

## 2013-10-26 DIAGNOSIS — L2089 Other atopic dermatitis: Secondary | ICD-10-CM

## 2013-10-26 MED ORDER — FLUCONAZOLE 150 MG PO TABS: ORAL_TABLET | ORAL | Status: DC

## 2013-10-26 MED ORDER — PREDNISONE 20 MG PO TABS: ORAL_TABLET | ORAL | Status: DC

## 2013-10-26 MED ORDER — METHYLPREDNISOLONE ACETATE 80 MG/ML IJ SUSP
80.0000 mg | Freq: Once | INTRAMUSCULAR | Status: AC
  Administered 2013-10-26: 80 mg via INTRAMUSCULAR

## 2013-10-26 MED ORDER — SULFAMETHOXAZOLE-TMP DS 800-160 MG PO TABS: 1.0000 | ORAL_TABLET | Freq: Two times a day (BID) | ORAL | Status: DC

## 2013-10-26 NOTE — Progress Notes (Signed)
   Subjective:    Patient ID: Terri Daniels, female    DOB: 1956-03-14, 58 y.o.   MRN: [id number]  HPI Patient in today c/o rash that started in February- Started on her right hand just a small itchy spot.Saw her dermatologist and was dx with contact dermatiitis. Was treated with cream which did not help- SHe went back and was given keflex and that cleared it up for about 1 week. As soon as she came off of the keflex it started coming back and now it is all over. She flew to [facility] to see her mom and saw a Doctor out there and hey diagnosed her with folliculitis and was put on minocycline daily and is currently still taking. She has had a similar ash in the past and saw D.Steadman and she treated her with fluconazole and prednisone along with sulfa.    Review of Systems  Constitutional: Negative.   HENT: Negative.   Cardiovascular: Negative.   Gastrointestinal: Negative.   Genitourinary: Negative.   Neurological: Negative.   Psychiatric/Behavioral: Negative.   All other systems reviewed and are negative.      Objective:   Physical Exam  Constitutional: She is oriented to person, place, and time. She appears well-developed and well-nourished.  Cardiovascular: Normal rate, regular rhythm and normal heart sounds.   Pulmonary/Chest: Effort normal and breath sounds normal.  Neurological: She is alert and oriented to person, place, and time.  Skin: Skin is warm.  erythematus spots on hands, upper arms and legs, Raised varying in size and appear to have been scratched.   BP 124/77  Pulse 86  Temp(Src) 97.9 F (36.6 C) (Oral)  Ht 5\' 9"  (1.753 m)  Wt 184 lb (83.462 kg)  BMI 27.16 kg/m2        Assessment & Plan:  1. Papular atopic dermatitis Do  Not scratch or pick area Cool compresses if needed - methylPREDNISolone acetate (DEPO-MEDROL) injection 80 mg; Inject 1 mL (80 mg total) into the muscle once.  Meds ordered this encounter  Medications  . methylPREDNISolone acetate  (DEPO-MEDROL) injection 80 mg    Sig:   . sulfamethoxazole-trimethoprim (BACTRIM DS) 800-160 MG per tablet    Sig: Take 1 tablet by mouth 2 (two) times daily.    Dispense:  20 tablet    Refill:  0    Order Specific Question:  Supervising Provider    Answer:  Chipper Herb [[id number]]  . fluconazole (DIFLUCAN) 150 MG tablet    Sig: 1 po q week x 2 weeks    Dispense:  2 tablet    Refill:  0    Order Specific Question:  Supervising Provider    Answer:  Chipper Herb [[id number]]  . predniSONE (DELTASONE) 20 MG tablet    Sig: 2 po at sametime daily X 5 days- start Tuesday    Dispense:  10 tablet    Refill:  0    Order Specific Question:  Supervising Provider    Answer:  Chipper Herb [[id number]]   RO prn or if not improving  Mary-Margaret Hassell Done, FNP

## 2013-10-26 NOTE — Patient Instructions (Signed)
Stasis Dermatitis °Stasis dermatitis occurs when veins lose the ability to pump blood back to the heart (poor venous circulation). It causes a reddish-purple to brownish scaly, itchy rash on the legs. The rash comes from pooling of blood (stasis). °CAUSES  °This occurs because the veins do not work very well anymore or because pressure may be increased in the veins due to other conditions. With blood pooling, the increased pressure in the tiny blood vessels (capillaries) causes fluid to leak out of the capillaries into the tissue. The extra fluid makes it harder for the blood to feed the cells and get rid of waste products. °SYMPTOMS  °Stasis dermatitis appears as red, scaly, itchy patches on the legs. A yellowish or light brown discoloration is also present. Due to scratching or other injury, these patches can become an ulcer. This ulcer may remain for long periods of time. The ulcer can also become infected. Swelling of the legs is often present with stasis dermatitis. If the leg is swollen, this increases the risk of infection and further damage to the skin. Sometimes, intense itching, tingling, and burning occurs before signs of stasis dermatitis appear. You may find yourself scratching the insides of your ankles or rubbing your ankles together before the rash appears. After healing, there are often brown spots on the affected skin. °DIAGNOSIS  °Your caregiver makes this diagnosis based on an exam. Other tests may be done to better understand the cause. °TREATMENT  °If underlying conditions are present, they must be treated. Some of these conditions are heart failure, thyroid problems, poor nutrition, and varicose veins. °· Cortisone creams and ointments applied to the skin (topically) may be needed, as well as medicine to reduce swelling in the legs (diuretics). °· Compression stockings or an elastic wrap may also be needed to reduce swelling. °· If there is an infection, antibiotic medicines may also be  used. °HOME CARE INSTRUCTIONS  °· Try to rest and raise (elevate) the affected leg above the level of the heart, if possible. °· Burow's solution wet packs applied for 30 minutes, 3 times daily, will help the weepy rash. Stop using the packs before your skin gets too dry. You can also use a mixture of 3 parts white vinegar to 1 quart water. °· Grease your legs daily with ointments, such as petroleum jelly, to fight dryness. °· Avoid scratching or injuring the area. °SEEK IMMEDIATE MEDICAL CARE IF:  °· Your rash gets worse. °· An ulcer forms. °· You have an oral temperature above 102° F (38.9° C), not controlled by medicine. °· You have any other severe symptoms. °Document Released: 08/23/2005 Document Revised: 08/06/2011 Document Reviewed: 10/03/2009 °ExitCare® Patient Information ©2014 [employer]. ° °

## 2013-11-02 ENCOUNTER — Telehealth: Payer: Self-pay | Admitting: Nurse Practitioner

## 2013-11-02 NOTE — Telephone Encounter (Signed)
Only blood test we could do is blood culture and if it was in her blood she would be really sick with a fever. SH edoes not need blood culture

## 2013-11-02 NOTE — Telephone Encounter (Signed)
Should not need another antibiotic. NTBS

## 2013-11-02 NOTE — Telephone Encounter (Signed)
Patient really wants blood work to see if it is in her blood?

## 2013-11-03 NOTE — Telephone Encounter (Signed)
Patient aware she is still on the antibiotic. Told her is still stays in sytem for a couple of days after last dose to call back if it does not  get better.

## 2013-11-06 ENCOUNTER — Ambulatory Visit (INDEPENDENT_AMBULATORY_CARE_PROVIDER_SITE_OTHER): Payer: BC Managed Care – PPO | Admitting: Physician Assistant

## 2013-11-06 ENCOUNTER — Encounter: Payer: Self-pay | Admitting: Physician Assistant

## 2013-11-06 VITALS — BP 127/88 | HR 94 | Temp 99.5°F | Ht 69.0 in | Wt 183.4 lb

## 2013-11-06 DIAGNOSIS — L259 Unspecified contact dermatitis, unspecified cause: Secondary | ICD-10-CM

## 2013-11-06 DIAGNOSIS — T148XXA Other injury of unspecified body region, initial encounter: Secondary | ICD-10-CM

## 2013-11-06 DIAGNOSIS — L309 Dermatitis, unspecified: Secondary | ICD-10-CM

## 2013-11-06 DIAGNOSIS — L089 Local infection of the skin and subcutaneous tissue, unspecified: Secondary | ICD-10-CM

## 2013-11-06 LAB — POCT CBC
Granulocyte percent: 62.3 %G (ref 37–80)
HEMATOCRIT: 42.1 % (ref 37.7–47.9)
HEMOGLOBIN: 13.6 g/dL (ref 12.2–16.2)
Lymph, poc: 2 (ref 0.6–3.4)
MCH: 31.1 pg (ref 27–31.2)
MCHC: 32.3 g/dL (ref 31.8–35.4)
MCV: 96.1 fL (ref 80–97)
MPV: 6.8 fL (ref 0–99.8)
POC Granulocyte: 3.7 (ref 2–6.9)
POC LYMPH PERCENT: 34.3 %L (ref 10–50)
Platelet Count, POC: 339 10*3/uL (ref 142–424)
RBC: 4.4 M/uL (ref 4.04–5.48)
RDW, POC: 13.8 %
WBC: 5.9 10*3/uL (ref 4.6–10.2)

## 2013-11-06 NOTE — Progress Notes (Signed)
Subjective:     Patient ID: Terri Daniels, female   DOB: 10-Jul-1955, 58 y.o.   MRN: [id number]  HPI Pt with a long term rash now for several months She has seen mult providers to include Derm and family pract She has been on oral steroids, ATB, and topical meds Rash started as painful to the lower legs and hands  Review of Systems + pain + scale No drainage form the sites No hx of same    Objective:   Physical Exam Scaly erythem rash to the hands bilat No surrounding erythema or induration No ulceration Wound cult pending CBC and ASO titer also done    Assessment:     Dermatitis    Plan:     Keep area clean/dry Hold further tx for now F/U pending labs

## 2013-11-06 NOTE — Patient Instructions (Signed)
Eczema Eczema, also called atopic dermatitis, is a skin disorder that causes inflammation of the skin. It causes a red rash and dry, scaly skin. The skin becomes very itchy. Eczema is generally worse during the cooler winter months and often improves with the warmth of summer. Eczema usually starts showing signs in infancy. Some children outgrow eczema, but it may last through adulthood.  CAUSES  The exact cause of eczema is not known, but it appears to run in families. People with eczema often have a family history of eczema, allergies, asthma, or hay fever. Eczema is not contagious. Flare-ups of the condition may be caused by:   Contact with something you are sensitive or allergic to.   Stress. SIGNS AND SYMPTOMS  Dry, scaly skin.   Red, itchy rash.   Itchiness. This may occur before the skin rash and may be very intense.  DIAGNOSIS  The diagnosis of eczema is usually made based on symptoms and medical history. TREATMENT  Eczema cannot be cured, but symptoms usually can be controlled with treatment and other strategies. A treatment plan might include:  Controlling the itching and scratching.   Use over-the-counter antihistamines as directed for itching. This is especially useful at night when the itching tends to be worse.   Use over-the-counter steroid creams as directed for itching.   Avoid scratching. Scratching makes the rash and itching worse. It may also result in a skin infection (impetigo) due to a break in the skin caused by scratching.   Keeping the skin well moisturized with creams every day. This will seal in moisture and help prevent dryness. Lotions that contain alcohol and water should be avoided because they can dry the skin.   Limiting exposure to things that you are sensitive or allergic to (allergens).   Recognizing situations that cause stress.   Developing a plan to manage stress.  HOME CARE INSTRUCTIONS   Only take over-the-counter or  prescription medicines as directed by your health care provider.   Do not use anything on the skin without checking with your health care provider.   Keep baths or showers short (5 minutes) in warm (not hot) water. Use mild cleansers for bathing. These should be unscented. You may add nonperfumed bath oil to the bath water. It is best to avoid soap and bubble bath.   Immediately after a bath or shower, when the skin is still damp, apply a moisturizing ointment to the entire body. This ointment should be a petroleum ointment. This will seal in moisture and help prevent dryness. The thicker the ointment, the better. These should be unscented.   Keep fingernails cut short. Children with eczema may need to wear soft gloves or mittens at night after applying an ointment.   Dress in clothes made of cotton or cotton blends. Dress lightly, because heat increases itching.   A child with eczema should stay away from anyone with fever blisters or cold sores. The virus that causes fever blisters (herpes simplex) can cause a serious skin infection in children with eczema. SEEK MEDICAL CARE IF:   Your itching interferes with sleep.   Your rash gets worse or is not better within 1 week after starting treatment.   You see pus or soft yellow scabs in the rash area.   You have a fever.   You have a rash flare-up after contact with someone who has fever blisters.  Document Released: 05/11/2000 Document Revised: 03/04/2013 Document Reviewed: 12/15/2012 [facility] Patient Information 2014 [employer].

## 2013-11-07 LAB — ANTISTREPTOLYSIN O TITER: ASO: 35.3 IU/mL (ref 0.0–200.0)

## 2013-11-08 LAB — AEROBIC CULTURE

## 2013-11-09 ENCOUNTER — Telehealth: Payer: Self-pay | Admitting: Physician Assistant

## 2013-11-10 ENCOUNTER — Telehealth: Payer: Self-pay | Admitting: Physician Assistant

## 2013-11-10 NOTE — Telephone Encounter (Signed)
Pt wanted to be seen for rash appt scheduled

## 2013-11-11 NOTE — Telephone Encounter (Signed)
Message copied by Marin Olp on Wed Nov 11, 2013  4:33 PM ------      Message from: Lodema Pilot      Created: Wed Nov 11, 2013  1:03 PM       Pls inform all of the labs were nl      Celiac dz would need further testing       ------

## 2013-11-17 ENCOUNTER — Ambulatory Visit (INDEPENDENT_AMBULATORY_CARE_PROVIDER_SITE_OTHER): Payer: BC Managed Care – PPO | Admitting: Physician Assistant

## 2013-11-17 ENCOUNTER — Encounter: Payer: Self-pay | Admitting: Physician Assistant

## 2013-11-17 VITALS — BP 118/82 | HR 99 | Temp 98.3°F | Ht 69.0 in | Wt 183.0 lb

## 2013-11-17 DIAGNOSIS — L259 Unspecified contact dermatitis, unspecified cause: Secondary | ICD-10-CM

## 2013-11-17 DIAGNOSIS — L309 Dermatitis, unspecified: Secondary | ICD-10-CM

## 2013-11-17 MED ORDER — VALACYCLOVIR HCL 500 MG PO TABS: 500.0000 mg | ORAL_TABLET | Freq: Two times a day (BID) | ORAL | Status: DC

## 2013-11-17 NOTE — Progress Notes (Signed)
Subjective:     Patient ID: Terri Daniels, female   DOB: 01-Jan-1956, 58 y.o.   MRN: [id number]  HPI Pt here for review of her rash States sx have improved Improved since not doing the dishes Needs rf of Valtrex for cold sore  Review of Systems No pain or drainage from the sites on her hands    Objective:   Physical Exam Area to both hands almost completely healed No drainage or ulceration noted Still with a sl scale    Assessment:     Dermatitis    Plan:     Question if may be related to sensitivity to soap or latex gloves She will look for latex free glove Reviewed all nl results with pt RF of Valtrex done today F/U prn

## 2013-11-17 NOTE — Patient Instructions (Signed)

## 2013-12-15 ENCOUNTER — Telehealth: Payer: Self-pay | Admitting: Family Medicine

## 2013-12-15 NOTE — Telephone Encounter (Signed)
Appt given per patients request 

## 2013-12-16 ENCOUNTER — Ambulatory Visit (INDEPENDENT_AMBULATORY_CARE_PROVIDER_SITE_OTHER): Payer: BC Managed Care – PPO | Admitting: Family Medicine

## 2013-12-16 ENCOUNTER — Encounter: Payer: Self-pay | Admitting: Family Medicine

## 2013-12-16 VITALS — BP 125/80 | HR 89 | Temp 99.8°F | Ht 69.0 in | Wt 183.4 lb

## 2013-12-16 DIAGNOSIS — L259 Unspecified contact dermatitis, unspecified cause: Secondary | ICD-10-CM

## 2013-12-16 DIAGNOSIS — L309 Dermatitis, unspecified: Secondary | ICD-10-CM

## 2013-12-16 MED ORDER — ACYCLOVIR 400 MG PO TABS: ORAL_TABLET | ORAL | Status: DC

## 2013-12-16 NOTE — Progress Notes (Signed)
   Subjective:    Patient ID: Terri Daniels, female    DOB: 1955-08-16, 58 y.o.   MRN: [id number]  HPI 58 year old female with recurring dermatitis. She has seen multiple providers over the past several months had been tried on oral antibiotics topical steroids to name a few. The rash is migratory she is concerned today about shingles but I see nothing in her history or on her presentation today that would suggest shingles she has had the shingles vaccine    Review of Systems  Constitutional: Negative.   HENT: Negative.   Eyes: Negative.   Respiratory: Negative.   Endocrine: Negative.   Genitourinary: Negative.   Skin: Positive for rash.       Objective:   Physical Exam  Vitals reviewed. Skin:  Single papular spots on trunk, R arm, as well as some dry areas on dorsum of each hand          Assessment & Plan:  Dermatitis of undetermined etiology. I reviewed several of her pictures on her phone soft. Some of the lesions could be consistent with herpes simplex and either history might suggest that. 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 I do not think this will cause any harm. I have also suggested eucerin to keep the areas moist  Wardell Honour MD

## 2014-01-13 ENCOUNTER — Other Ambulatory Visit: Payer: Self-pay | Admitting: Internal Medicine

## 2014-03-04 ENCOUNTER — Telehealth: Payer: Self-pay | Admitting: Internal Medicine

## 2014-03-04 MED ORDER — BUDESONIDE-FORMOTEROL FUMARATE 80-4.5 MCG/ACT IN AERO: 2.0000 | INHALATION_SPRAY | Freq: Two times a day (BID) | RESPIRATORY_TRACT | Status: AC

## 2014-03-04 NOTE — Telephone Encounter (Signed)
Patient Instructions     Symbicort 80 Take 2 puffs first thing in am and then another 2 puffs about 12 hours later.  Only use your albuterol as a rescue medication to be used if you can't catch your breath by resting or doing a relaxed purse lip breathing pattern. The less you use it, the better it will work when you need it. Ok to use it up to 2 puffs every 4 hours but goal is less than twice weekly  If your breathing worsens or you need to use your rescue inhaler more than twice weekly or wake up more than twice a month with any respiratory symptoms or require more than two rescue inhalers per year, we need to see you right away because this means we're not controlling the underlying problem (inflammation) adequately.  Rescue inhalers do not control inflammation and overuse can lead to unnecessary and costly consequences. They can make you feel better temporarily but eventually they will quit working effectively much as sleep aids lead to more insomnia if used regularly.  If you are satisfied with your treatment plan let your doctor know and he/she can either refill your medications or you can return here when your prescription runs out.  If in any way you are not 100% satisfied, please tell us. If 100% better, tell your friends!           Spoke with the pt  I advised that we will refill this time, but as long as she is doing well her PCP can refill  If PCP will not refill, we will need to see her back here before rx runs out  Her breathing is doing well and so she will see if her PCP will take over  Rx sent with 1 rf to last until she sees PCP  Nothing further needed

## 2014-03-15 ENCOUNTER — Ambulatory Visit: Payer: BC Managed Care – PPO | Admitting: Internal Medicine

## 2014-05-13 ENCOUNTER — Telehealth: Payer: Self-pay | Admitting: *Deleted

## 2014-05-13 NOTE — Telephone Encounter (Signed)
requested records

## 2014-05-13 NOTE — Telephone Encounter (Signed)
records received.. disregard the name lynn, it was lisa.Marland Kitchen

## 2014-05-14 ENCOUNTER — Encounter: Payer: Self-pay | Admitting: Cardiology

## 2014-05-14 ENCOUNTER — Ambulatory Visit (INDEPENDENT_AMBULATORY_CARE_PROVIDER_SITE_OTHER): Payer: BC Managed Care – PPO | Admitting: Cardiology

## 2014-05-14 VITALS — BP 130/88 | HR 81 | Ht 69.0 in | Wt 189.0 lb

## 2014-05-14 DIAGNOSIS — R079 Chest pain, unspecified: Secondary | ICD-10-CM

## 2014-05-14 NOTE — Patient Instructions (Addendum)
The current medical regimen is effective;  continue present plan and medications.  Your physician has requested that you have an exercise tolerance test. For further information please visit [email or web address]. Please also follow instruction sheet, as given.  Follow up will be based on these results.  Thank you for choosing [facility]!!

## 2014-05-14 NOTE — Progress Notes (Signed)
[place]. [address] [place], [place]  [address] Phone: [phone] Fax:  [phone]  Date:  05/14/2014   ID:  Terri Daniels, DOB 1955/07/02, MRN [id number]  PCP:  Terri Daniels, Terri G, MD   History of Present Illness: Terri Daniels is a 58 y.o. female here for evaluation of chest pain with prior history of GERD and family history of coronary artery disease-father. She's been having a pressure feeling in her chest recently. Was sleeping during last occurrence. Father had coronary disease at age 75. The chest pain does not seem to be related with exertion or stress, no radiation. Aspirin seems to help. Does not take that this is GERD. Enjoys spicy food. Drinks 1 glass of wine daily. Currently she is being treated for asthma with Symbicort. Over the summer, she had an awful case of contact dermatitis, saw over 10 doctors, finally [facility] performed a skin biopsy. She is allergic to a formaldehyde derivative.  5 years ago, [facility] syncope. OK.   Breast cancer, Chemo and Rad, Uterine.   Non smoker.    Wt Readings from Last 3 Encounters:  12/16/13 183 lb 6.4 oz (83.19 kg)  11/17/13 183 lb (83.008 kg)  11/06/13 183 lb 6.4 oz (83.19 kg)     Past Medical History  Diagnosis Date  . Asthma   . Allergic rhinitis, cause unspecified   . Breast cancer   . Uterine cancer     Past Surgical History  Procedure Laterality Date  . Vesicovaginal fistula closure w/ tah    . Breast surgery  1995    mastectomy right  . Breast lumpectomy  1984  . Ectopic pregnancy surgery      x2  . Total abdominal hysterectomy  1998  . Tonsillectomy      Current Outpatient Prescriptions  Medication Sig Dispense Refill  . albuterol (VENTOLIN HFA) 108 (90 BASE) MCG/ACT inhaler Up to every 4 hours as needed 18 Daniels 1  . budesonide-formoterol (SYMBICORT) 80-4.5 MCG/ACT inhaler Inhale 2 puffs into the lungs 2 (two) times daily. 1 Inhaler 1  . ibuprofen (ADVIL,MOTRIN) 800 MG tablet TAKE 1 TABLET (800 MG  TOTAL) BY MOUTH EVERY 8 (EIGHT) HOURS AS NEEDED. 90 tablet 1  . omeprazole (PRILOSEC) 20 MG capsule Take 20 mg by mouth daily.     No current facility-administered medications for this visit.    Allergies:    Allergies  Allergen Reactions  . Codeine Other (See Comments)    "Severe constipation"  . Morphine Nausea Only    Social History:  The patient  reports that she has never smoked. She has never used smokeless tobacco. She reports that she drinks about 6.0 oz of alcohol per week. She reports that she does not use illicit drugs.   Family History  Problem Relation Age of Onset  . Asthma Brother   . Heart disease Father   . Breast cancer Mother   . Colon cancer Maternal Uncle 33    ROS:  Please see the history of present illness.  Denies any fevers, chills, orthopnea, PND, syncope, bleeding, rashes  All other systems reviewed and negative.   PHYSICAL EXAM: VS:  BP 130/88 mmHg  Pulse 81  Ht 5\' 9"  (1.753 m)  Wt  Well nourished, well developed, in no acute distress HEENT: normal, Wright/AT, EOMI Neck: no JVD, normal carotid upstroke, no bruit Cardiac:  normal S1, S2; RRR; no murmur Lungs:  clear to auscultation bilaterally, no wheezing, rhonchi or rales Abd:  soft, nontender, no hepatomegaly, no bruits Ext: no edema, 2+ distal pulses Skin: warm and dry GU: deferred Neuro: no focal abnormalities noted, AAO x 3  EKG:  05/14/14-   sinus rhythm, 81, no other abnormalities   Labs: Creatinine 0.9, potassium 4.5, TSH 3.1, LDL 130 triglycerides 92 HDL 78  ASSESSMENT AND PLAN:  1. Atypical chest pain - pressure-like sensation, nonexertional woke her up once from her sleep. States that this is not typical GERD. She's had friends that have had normal EKGs and then have had heart attacks. With her father family history, chest pressure, I would like to proceed with exercise treadmill test. Baseline ECG unremarkable. 2. I will follow-up with testing.  Signed, Candee Furbish, MD [facility]    05/14/2014 12:04 PM

## 2014-06-04 ENCOUNTER — Ambulatory Visit (INDEPENDENT_AMBULATORY_CARE_PROVIDER_SITE_OTHER): Payer: BC Managed Care – PPO | Admitting: Nurse Practitioner

## 2014-06-04 DIAGNOSIS — R079 Chest pain, unspecified: Secondary | ICD-10-CM

## 2014-06-04 NOTE — Progress Notes (Signed)
Exercise Treadmill Test  Pre-Exercise Testing Evaluation Rhythm: sinus tachycardia  Rate: 92 bpm     Test  Exercise Tolerance Test Ordering MD: Candee Furbish, MD  Interpreting MD: Ignacia Bayley, NP  Unique Test No: 1  Treadmill:  1  Indication for ETT: chest pain - rule out ischemia  Contraindication to ETT: No   Stress Modality: exercise - treadmill  Cardiac Imaging Performed: non   Protocol: standard Bruce - maximal  Max BP:  206/82  Max MPHR (bpm):  162 85% MPR (bpm):  138  MPHR obtained (bpm):  144 % MPHR obtained:  87  Reached 85% MPHR (min:sec):  713 Total Exercise Time (min-sec):  9:00  Workload in METS:  10.2 Borg Scale: 15  Reason ETT Terminated:  fatigue    ST Segment Analysis At Rest: normal ST segments - no evidence of significant ST depression With Exercise: no evidence of significant ST depression  Other Information Arrhythmia:  No Angina during ETT:  absent (0) Quality of ETT:  diagnostic  ETT Interpretation:  normal - no evidence of ischemia by ST analysis  Comments: Good exercise tolerance. No chest pain or SOB. No acute st/t changes. BP rose to 206/82 during exercise.  Recommendations: No further ischemic w/u.  Follow up with Dr. Marlou Porch as needed. Advised to check BP daily and report BP's consistently greater than 140 mmHg.

## 2014-11-03 ENCOUNTER — Encounter: Payer: Self-pay | Admitting: Internal Medicine

## 2017-09-18 ENCOUNTER — Encounter (INDEPENDENT_AMBULATORY_CARE_PROVIDER_SITE_OTHER): Payer: Self-pay | Admitting: Nurse Practitioner

## 2017-09-18 ENCOUNTER — Ambulatory Visit (INDEPENDENT_AMBULATORY_CARE_PROVIDER_SITE_OTHER): Admitting: Nurse Practitioner

## 2017-09-18 VITALS — BP 128/81 | HR 83 | Temp 98.8°F | Ht 68.5 in | Wt 193.0 lb

## 2017-09-18 MED ORDER — BIOTIN 1 MG PO CAPS: ORAL_CAPSULE | ORAL | Status: AC

## 2017-09-18 MED ORDER — COLLAGEN PO: ORAL | Status: AC

## 2017-09-18 MED ORDER — CO Q 10 PO: ORAL | Status: AC

## 2017-09-18 MED ORDER — VITAMIN D3 1000 UNIT OR TABS: 4000.00 [IU] | ORAL_TABLET | ORAL | Status: AC

## 2017-09-18 MED ORDER — VENTOLIN HFA IN
RESPIRATORY_TRACT | Status: DC
Start: ? — End: 2017-09-18

## 2017-09-18 MED ORDER — OMEPRAZOLE 20 MG OR CPDR
20.0000 mg | DELAYED_RELEASE_CAPSULE | Freq: Every day | ORAL | 1 refills | Status: DC
Start: 2017-09-18 — End: 2018-03-26

## 2017-09-18 MED ORDER — MILK THISTLE 250 MG OR CAPS: ORAL_CAPSULE | ORAL | Status: AC

## 2017-09-18 MED ORDER — SYMBICORT IN
RESPIRATORY_TRACT | Status: DC
Start: ? — End: 2017-09-18

## 2017-09-18 MED ORDER — KRILL OIL PO
ORAL | Status: DC
Start: ? — End: 2020-07-14

## 2017-09-18 MED ORDER — BUDESONIDE-FORMOTEROL FUMARATE 80-4.5 MCG/ACT IN AERO
2.0000 | INHALATION_SPRAY | Freq: Two times a day (BID) | RESPIRATORY_TRACT | 3 refills | Status: DC
Start: 2017-09-18 — End: 2018-08-19

## 2017-09-18 MED ORDER — OMEPRAZOLE 20 MG OR CPDR
20.00 mg | DELAYED_RELEASE_CAPSULE | Freq: Every day | ORAL | Status: DC
Start: ? — End: 2017-09-18

## 2017-09-18 MED ORDER — IBUPROFEN 800 MG OR TABS
800.00 mg | ORAL_TABLET | Freq: Four times a day (QID) | ORAL | Status: DC | PRN
Start: ? — End: 2018-07-14

## 2017-09-18 MED ORDER — JOINT HEALTH PO: ORAL | Status: AC

## 2017-09-18 MED ORDER — ALBUTEROL SULFATE 108 (90 BASE) MCG/ACT IN AERS
2.0000 | INHALATION_SPRAY | Freq: Two times a day (BID) | RESPIRATORY_TRACT | 3 refills | Status: DC
Start: 2017-09-18 — End: 2019-03-13

## 2017-09-18 MED ORDER — CINNAMON PO
ORAL | Status: DC
Start: ? — End: 2021-08-30

## 2017-09-18 MED ORDER — VALACYCLOVIR 50 MG/ML OR SUSP (COMPOUNDED)
1000.00 mg | Freq: Three times a day (TID) | Status: DC
Start: ? — End: 2018-09-28

## 2017-09-18 MED ORDER — CLOBETASOL PROPIONATE 0.05 % EX CREA
1.00 | TOPICAL_CREAM | Freq: Two times a day (BID) | CUTANEOUS | Status: DC
Start: ? — End: 2018-09-25

## 2017-09-18 NOTE — Progress Notes (Signed)
SUBJECETIVE    Lynn Tran is a 62 year old female who presents to clinic for Establish Care (establish care )      HPI   Lynn Tran is here to establish care today with [facility] because she recently moved from [place] to care for her mother who lives here in [place].   We have reviewed her PMH and updated her Health Maintenance. She brought in previous PCP notes which have been scanned into her Media tab.       MEDICAL PROFILE    PAST MEDICAL AND SURGICAL HISTORY:    has no past surgical history on file.  Past Medical History:   Diagnosis Date   . Asthma    . Breast cancer (CMS-HCC)    . Uterine cancer (CMS-HCC)         CURRENT MEDICATIONS:    Current Outpatient Medications:   .  albuterol (VENTOLIN HFA) 108 (90 Base) MCG/ACT inhaler, Inhale 2 puffs by mouth 2 times daily., Disp: 1 Inhaler, Rfl: 3  .  Biotin 1 MG CAPS, , Disp: , Rfl:   .  budesonide-formoterol (SYMBICORT) 80-4.5 MCG/ACT inhaler, Inhale 2 puffs by mouth 2 times daily., Disp: 1 Inhaler, Rfl: 3  .  Cholecalciferol (VITAMIN D3) 1000 units tablet, 4,000 Int'l Units., Disp: , Rfl:   .  CINNAMON PO, , Disp: , Rfl:   .  clobetasol propionate (TEMOVATE) 0.05 % cream, Apply 1 Application topically 2 times daily. Apply to affected area twice a day, Disp: , Rfl:   .  Coenzyme Q10 (CO Q 10 PO), , Disp: , Rfl:   .  COLLAGEN PO, , Disp: , Rfl:   .  Glucosamine-MSM-Hyaluronic Acd (JOINT HEALTH PO), , Disp: , Rfl:   .  ibuprofen (MOTRIN) 800 MG tablet, Take 800 mg by mouth every 6 hours as needed for Mild Pain (Pain Score 1-3)., Disp: , Rfl:   .  KRILL OIL PO, , Disp: , Rfl:   .  Milk Thistle 250 MG CAPS, , Disp: , Rfl:   .  omeprazole (PRILOSEC) 20 MG capsule, Take 1 capsule (20 mg) by mouth daily., Disp: 90 capsule, Rfl: 1  .  valACYclovir (VALTREX) 50 MG/ML SUSP, 1,000 mg every 8 hours., Disp: , Rfl:      ALLERGIES:  Dmdm hydantoin; Codeine; and Morphine    FAMILY HISTORY:  family history includes Cancer in her mother.    SOCIAL  HISTORY:    reports that  has never smoked. she has never used smokeless tobacco. She reports that she drinks alcohol. She reports that she does not use drugs.     REVIEW OF SYSTEMS    noncontributory other than what was discussed in HPI  Review of Systems   Constitutional: Negative for appetite change, chills, fatigue, fever and unexpected weight change.   HENT: Negative for hearing loss, nosebleeds and tinnitus.    Eyes: Negative for photophobia and visual disturbance.   Respiratory: Negative for cough, chest tightness and shortness of breath.    Cardiovascular: Negative for chest pain, palpitations and leg swelling.   Gastrointestinal: Negative for abdominal pain, blood in stool, diarrhea, nausea and vomiting.   Endocrine: Negative for cold intolerance and heat intolerance.   Genitourinary: Negative for dysuria, flank pain and hematuria.   Musculoskeletal: Negative for arthralgias, gait problem and neck stiffness.   Skin: Negative for color change.   Neurological: Negative for dizziness, syncope, weakness, light-headedness and headaches.   Hematological: Negative for adenopathy. Does  not bruise/bleed easily.   Psychiatric/Behavioral: Negative for agitation, confusion, dysphoric mood and hallucinations.       PHYSICAL EXAMINATION       09/18/17  1658   BP: 128/81   Pulse: 83   Temp: 98.8 F (37.1 C)   SpO2: 96%     GENERAL APPEARANCE: no acute distress, pleasant.    HEART: no murmurs, regular rate and rhythm.    LUNGS: clear to auscultation bilaterally, no wheezes, rhonchi, rales.    PSYCH: appropriate mood and affect , oriented x 3 .        ASSESSMENT AND PLAN    H&P reviewed, addressed issues and complaints   Exam Completed  Health maintenance tab updated, necessary outstanding orders placed  Medications up to date, necessary refills ordered.     1. Encounter for wellness examination  - CBC w/ Diff Lavender  - Comprehensive Metabolic Panel Green  - Lipid Panel Green Plasma Separator Tube  - TSH, Blood - See  Instructions  - Vitamin D, 25-Hydroxy, Blood Yellow serum separator tube  Health Maintenance: reviewed and up to date, orders placed for outstanding requirements this visit.   Last PAP: DUE next visit Q3 years for history of Uterine cancer with total hysterectomy, no history of abnormals   DEXA: last one was 5 years ago; used to be on fosamax and boniva for 8 years, developed bone spurs from being on it.   COLONOSCOPY: updated in HM uncle had Della Scrivener cancer, hx of polyps goes every 5 years.  SHINGLES: got both doses  DENTIST: no dentist as of yet   DERM: mother has had hx melanoma, referral placed for derm today   EYE EXAM: yes wears glasses, referral placed for eye doc for annual vision screening  DIET: stress eating right now d/t caregiver stress of caring for her mother  EXERCISE: doesn't really exercise   BOWEL HABITS: Appropriate,  uncle had [place] cacncer .  Denies blood or mucous in stool. No constipation, diarrhea, or straining  Updated Fam Hx next visit.     2. Gastroesophageal reflux disease, esophagitis presence not specified  - omeprazole (PRILOSEC) 20 MG capsule; Take 1 capsule (20 mg) by mouth daily.  Dispense: 90 capsule; Refill: 1  -managed well with above medication unless she drinks margaritas   -refilled today for 90 day supply     3. Asthma, unspecified asthma severity, unspecified whether complicated, unspecified whether persistent  4. Mild intermittent asthma without complication  - albuterol (VENTOLIN HFA) 108 (90 Base) MCG/ACT inhaler; Inhale 2 puffs by mouth 2 times daily.  Dispense: 1 Inhaler; Refill: 3  - budesonide-formoterol (SYMBICORT) 80-4.5 MCG/ACT inhaler; Inhale 2 puffs by mouth 2 times daily.  Dispense: 1 Inhaler; Refill: 3  -pt says shes under control at this time   -Uses Symbicort BID daily and Albuterol PRN as a rescue inhaler     5. Contact dermatitis due to other agent, unspecified contact dermatitis type  -got patch testing done at [facility] that discovered contact dermatitis  to an ingredient she used to find in her soaps/shampoos.   -manages it with clobetazol topical   -derm referral placed for annual skin checks, mother has hx of melanoma     6. Herpes simplex type 1 infection  -takes valacyclovir PRN for cold sore breakouts    7. History of torn meniscus of left knee  -used to get cortisone shots Q49months, needs referral for ortho for initiating that again because she says it is bothering her.  8. History of breast cancer  -diagnosed at age 70(1984)- had a lumpectomy and radiation at that time  -breast [place] returned 11 years later, she got a R sided mastectomy with reconstruction and chemotherapy   -in remission since then     9. History of uterine cancer  -diagnosed in 1998, had total hysterectomy at that time. Does not recall last pap  -due for pap next visit and Q3 years. No history of abnormal paps     10. Screening for osteoporosis  - X-RAY Dexa (Bone Density) Skeletal    11.    There are no Patient Instructions on file for this visit.    FOLLOW UP   No Follow-up on file.      Irineo Axon Gianluca Chhim, NP  Attending Physician: Dr. Gale Journey, MD

## 2017-09-20 ENCOUNTER — Telehealth (INDEPENDENT_AMBULATORY_CARE_PROVIDER_SITE_OTHER): Payer: Self-pay | Admitting: Nurse Practitioner

## 2017-09-20 NOTE — Telephone Encounter (Signed)
See below TE from [place], can you ask patient if she has tried either of the two below. If so and they havent worked we can notify jacqui to start PA if not I will send in one of those for her instead. Thanks!

## 2017-09-20 NOTE — Telephone Encounter (Signed)
Covered alternative are Advair and COMBIVENT RESPIMAT 20-100 MCG. Are we able to switch? If not, I can submit a PA. She needs to have tried those two before they approve Symbicort.

## 2017-09-20 NOTE — Telephone Encounter (Signed)
Hi Jacq! Can we start a PA for the patients Symbicort? I already spoke to [facility] and she said this was fine. Thank you!!    Ins Phone #: [phone]  ID#: [id number] Laurene Footman: [id number]  PCN: [id number]

## 2017-09-23 NOTE — Telephone Encounter (Signed)
Submitted PA to [facility]

## 2017-09-23 NOTE — Telephone Encounter (Signed)
Patient called, says she has tried a lot of alternatives before. She believes they included the combivent and the advair and said 'they wreacked havoc on my lungs'

## 2017-09-24 NOTE — Telephone Encounter (Signed)
Pharmacy and patient informed

## 2017-09-24 NOTE — Telephone Encounter (Signed)
PA Approved. Please notify patient and pharmacy

## 2017-12-03 LAB — COMPREHENSIVE METABOLIC PANEL, BLOOD
A/G Ratio: 1.8 (ref 1.2–2.2)
ALT (SGPT): 24 IU/L (ref 0–32)
AST: 22 IU/L (ref 0–40)
Albumin: 4.2 g/dL (ref 3.6–4.8)
Alkaline Phos: 67 IU/L (ref 39–117)
BUN/Creatinine Ratio: 14 (ref 12–28)
BUN: 12 mg/dL (ref 8–27)
Bilirubin, Total: 0.4 mg/dL (ref 0.0–1.2)
Calcium: 9.5 mg/dL (ref 8.7–10.3)
Carbon Dioxide: 22 mmol/L (ref 20–29)
Chloride: 108 mmol/L — ABNORMAL HIGH (ref 96–106)
Creatinine: 0.85 mg/dL (ref 0.57–1.00)
Globulin, Total: 2.3 g/dL (ref 1.5–4.5)
Glucose: 86 mg/dL (ref 65–99)
Potassium: 4.8 mmol/L (ref 3.5–5.2)
Protein, Total, Serum: 6.5 g/dL (ref 6.0–8.5)
Sodium: 143 mmol/L (ref 134–144)
eGFR If Africn Am: 85 mL/min/{1.73_m2} (ref >59)
eGFR If NonAfricn Am: 74 mL/min/{1.73_m2} (ref >59)

## 2017-12-03 LAB — CBC WITH DIFF, BLOOD
Baso (Absolute): 0 10*3/uL (ref 0.0–0.2)
Basos: 1 %
Eos (Absolute): 0.1 10*3/uL (ref 0.0–0.4)
Eos: 2 %
Hematocrit: 41.2 % (ref 34.0–46.6)
Hemoglobin: 13.4 g/dL (ref 11.1–15.9)
Immature Grans (Abs): 0 10*3/uL (ref 0.0–0.1)
Immature Granulocytes: 0 %
Lymphs (Absolute): 1.4 10*3/uL (ref 0.7–3.1)
Lymphs: 30 %
MCH: 31.5 pg (ref 26.6–33.0)
MCHC: 32.5 g/dL (ref 31.5–35.7)
MCV: 97 fL (ref 79–97)
Monocytes(Absolute): 0.5 10*3/uL (ref 0.1–0.9)
Monocytes: 11 %
Neutrophils (Absolute): 2.5 10*3/uL (ref 1.4–7.0)
Neutrophils: 56 %
Platelets: 324 10*3/uL (ref 150–450)
RBC: 4.26 x10E6/uL (ref 3.77–5.28)
RDW: 14.3 % (ref 12.3–15.4)
WBC: 4.5 10*3/uL (ref 3.4–10.8)

## 2017-12-03 LAB — LIPID(CHOL FRACT) PANEL, BLOOD
Cholesterol: 228 mg/dL — ABNORMAL HIGH (ref 100–199)
HDL Cholesterol: 69 mg/dL (ref >39)
LDL Cholesterol Calc: 143 mg/dL — ABNORMAL HIGH (ref 0–99)
Non-HDL Cholesterol: 159 mg/dL — ABNORMAL HIGH (ref 0–129)
Triglycerides: 79 mg/dL (ref 0–149)
VLDL Cholesterol Cal: 16 mg/dL (ref 5–40)

## 2017-12-03 LAB — VITAMIN D, 25-OH TOTAL: Vitamin D, 25-Hydroxy: 55.5 ng/mL (ref 30.0–100.0)

## 2017-12-03 LAB — TSH, BLOOD: TSH: 3.09 u[IU]/mL (ref 0.450–4.500)

## 2018-03-26 ENCOUNTER — Other Ambulatory Visit (INDEPENDENT_AMBULATORY_CARE_PROVIDER_SITE_OTHER): Payer: Self-pay | Admitting: Nurse Practitioner

## 2018-03-26 DIAGNOSIS — K219 Gastro-esophageal reflux disease without esophagitis: Principal | ICD-10-CM

## 2018-03-26 MED ORDER — OMEPRAZOLE 20 MG OR CPDR
DELAYED_RELEASE_CAPSULE | ORAL | 1 refills | Status: DC
Start: 2018-03-26 — End: 2018-09-23

## 2018-04-03 ENCOUNTER — Telehealth (INDEPENDENT_AMBULATORY_CARE_PROVIDER_SITE_OTHER): Payer: Self-pay | Admitting: Nurse Practitioner

## 2018-04-03 DIAGNOSIS — Z1239 Encounter for other screening for malignant neoplasm of breast: Secondary | ICD-10-CM

## 2018-04-03 DIAGNOSIS — Z1211 Encounter for screening for malignant neoplasm of colon: Secondary | ICD-10-CM

## 2018-04-04 ENCOUNTER — Telehealth (INDEPENDENT_AMBULATORY_CARE_PROVIDER_SITE_OTHER): Payer: Self-pay | Admitting: Nurse Practitioner

## 2018-04-04 DIAGNOSIS — Z853 Personal history of malignant neoplasm of breast: Secondary | ICD-10-CM

## 2018-05-01 ENCOUNTER — Other Ambulatory Visit (INDEPENDENT_AMBULATORY_CARE_PROVIDER_SITE_OTHER): Payer: Self-pay | Admitting: Nurse Practitioner

## 2018-05-14 ENCOUNTER — Encounter (INDEPENDENT_AMBULATORY_CARE_PROVIDER_SITE_OTHER): Payer: Self-pay

## 2018-05-18 ENCOUNTER — Encounter: Payer: Self-pay | Admitting: Internal Medicine

## 2018-05-28 DIAGNOSIS — Z1501 Genetic susceptibility to malignant neoplasm of breast: Secondary | ICD-10-CM

## 2018-05-28 DIAGNOSIS — Z1509 Genetic susceptibility to other malignant neoplasm: Secondary | ICD-10-CM

## 2018-05-28 DIAGNOSIS — Z1502 Genetic susceptibility to malignant neoplasm of ovary: Secondary | ICD-10-CM

## 2018-05-28 HISTORY — DX: Genetic susceptibility to malignant neoplasm of breast: Z15.01

## 2018-05-28 HISTORY — DX: Genetic susceptibility to malignant neoplasm of ovary: Z15.02

## 2018-05-28 HISTORY — PX: OTHER PROCEDURE: U1053

## 2018-05-28 HISTORY — DX: Genetic susceptibility to other malignant neoplasm: Z15.09

## 2018-06-19 ENCOUNTER — Encounter (INDEPENDENT_AMBULATORY_CARE_PROVIDER_SITE_OTHER): Payer: Self-pay

## 2018-06-26 DIAGNOSIS — C20 Malignant neoplasm of rectum: Secondary | ICD-10-CM

## 2018-06-28 DIAGNOSIS — C20 Malignant neoplasm of rectum: Secondary | ICD-10-CM

## 2018-06-28 HISTORY — DX: Malignant neoplasm of rectum (CMS-HCC): C20

## 2018-07-14 ENCOUNTER — Ambulatory Visit (INDEPENDENT_AMBULATORY_CARE_PROVIDER_SITE_OTHER): Admitting: Family Medicine

## 2018-07-14 ENCOUNTER — Encounter (INDEPENDENT_AMBULATORY_CARE_PROVIDER_SITE_OTHER): Payer: Self-pay | Admitting: Family Medicine

## 2018-07-14 VITALS — BP 126/82 | HR 87 | Temp 99.0°F | Resp 14 | Ht 68.5 in

## 2018-07-14 DIAGNOSIS — C20 Malignant neoplasm of rectum: Secondary | ICD-10-CM

## 2018-07-14 DIAGNOSIS — M25562 Pain in left knee: Secondary | ICD-10-CM

## 2018-07-14 DIAGNOSIS — C800 Disseminated malignant neoplasm, unspecified: Secondary | ICD-10-CM

## 2018-07-14 DIAGNOSIS — L508 Other urticaria: Principal | ICD-10-CM

## 2018-07-14 DIAGNOSIS — L989 Disorder of the skin and subcutaneous tissue, unspecified: Secondary | ICD-10-CM

## 2018-07-14 DIAGNOSIS — G8929 Other chronic pain: Secondary | ICD-10-CM

## 2018-07-14 MED ORDER — ERYTHROMYCIN BASE 250 MG OR TABS
ORAL_TABLET | ORAL | Status: DC
Start: 2018-06-26 — End: 2018-09-25

## 2018-07-14 MED ORDER — NEOMYCIN SULFATE 500 MG OR TABS
ORAL_TABLET | ORAL | Status: DC
Start: 2018-06-26 — End: 2018-09-25

## 2018-07-14 MED ORDER — FAMOTIDINE 20 MG OR TABS
20.00 mg | ORAL_TABLET | Freq: Two times a day (BID) | ORAL | 0 refills | Status: DC
Start: 2018-07-14 — End: 2018-09-25

## 2018-07-14 MED ORDER — PREDNISONE 20 MG OR TABS
60.00 mg | ORAL_TABLET | Freq: Every day | ORAL | 0 refills | Status: AC
Start: 2018-07-14 — End: 2018-07-21

## 2018-07-14 MED ORDER — IBUPROFEN 800 MG OR TABS
800.00 mg | ORAL_TABLET | Freq: Three times a day (TID) | ORAL | 0 refills | Status: DC | PRN
Start: 2018-07-14 — End: 2019-02-04

## 2018-07-16 ENCOUNTER — Encounter (INDEPENDENT_AMBULATORY_CARE_PROVIDER_SITE_OTHER): Payer: Self-pay | Admitting: Family Medicine

## 2018-07-16 DIAGNOSIS — C800 Disseminated malignant neoplasm, unspecified: Secondary | ICD-10-CM

## 2018-07-16 DIAGNOSIS — L989 Disorder of the skin and subcutaneous tissue, unspecified: Secondary | ICD-10-CM

## 2018-07-16 DIAGNOSIS — G8929 Other chronic pain: Secondary | ICD-10-CM

## 2018-07-16 DIAGNOSIS — M25562 Pain in left knee: Secondary | ICD-10-CM

## 2018-07-16 DIAGNOSIS — L508 Other urticaria: Principal | ICD-10-CM

## 2018-07-17 ENCOUNTER — Telehealth (INDEPENDENT_AMBULATORY_CARE_PROVIDER_SITE_OTHER): Payer: Self-pay | Admitting: Family Medicine

## 2018-08-07 ENCOUNTER — Ambulatory Visit (INDEPENDENT_AMBULATORY_CARE_PROVIDER_SITE_OTHER): Payer: Medicaid HMO | Admitting: MS"

## 2018-08-07 DIAGNOSIS — C800 Disseminated malignant neoplasm, unspecified: Secondary | ICD-10-CM

## 2018-08-07 NOTE — Progress Notes (Signed)
[facility] Consultation    Patient: Lynn Tran  Date of Birth: 07-May-1956  [place] MR#: [id number]    Consult date: 08/07/2018  Reason for Referral: Personal and family history of cancer  Referring Provider: Lavonna Monarch  Genetic Counselor: [name], MS, [facility]    Personal and Family History  I met with Lynn Tran today for a genetics consultation regarding a personal history of cancer. Lynn Tran is a 63 year old woman with a personal history of right sided breast cancer at age 55, s/p lumpectomy and radiation.  She had a recurrence at age 10 leading to a right mastectomy and chemotherapy.  She was diagnosed with a poorly differentiated carcinosarcoma of the uterus at age 37, s/p TAH/BSO.  She had her first colonoscopy in 2011 and was on a 3-5 year schedule due to polyps; this year she was diagnosed with a rectal cancer at the time of her scope.      Menarche = 12  Nulliparous  OCP = none  HRT = none  EtOH = few drinks/night  Non-smoker       A detailed family history of cancer was ascertained and a pedigree was constructed. Lynn Tran's family history was reported as follows:     Immediate Family:   Her mother is 29 years old with a history of breast cancer at age 55 and a recurrence at age 44.  She underwent a TAH/BSO in her 62s and was diagnosed with melanoma on her thigh at age 9.  Her father passed away at age 58 from a heart attack.      Her brother is 60 years old.    Maternal Family:   She has three uncles who are deceased.  One died of colon cancer in his 30s and the other died of Cushing syndrome in his 87s.  Garwin Brothers are alive and well.     Her grandfather died of tuberculosis at age 83.  Her grandmother lived to age 65.      Paternal Family:   Her aunt died at age 83 and was diagnosed with breast cancer in her 78s.      Her grandfather died in his 13s.  Her grandmother was diagnosed with breast cancer in her 47s and died at age 72.      The maternal side  of the family is of English/French/Irish/Nordic descent and the paternal side of the family is of German/French/Russian descent.  There is no Ashkenazi Jewish ancestry.    Assessment   With a personal history of multiple early onset cancers matched with a family history, Lynn Tran is an appropriate candidate for genetic testing.     Breast cancer is the most frequently diagnosed cancer in women. In general, a woman's lifetime risk of developing breast cancer is approximately 12%, or 1 in 8 women.  Ovarian cancer is less prevalent, affecting approximately 1 in 45 women (1.5%). The majority of breast and ovarian cancer is not hereditary.  However, approximately 5-10% of all individuals with breast or ovarian cancer have inherited a single gene alteration (mutation) predisposing them to develop these diseases. Most families with very strong family histories of these cancers can be attributed to mutations in either of two genes, called BRCA1 and BRCA2.     Features of a family history associated with an increased likelihood of a hereditary susceptibility to breast and ovarian cancer typically include: several family members with breast and/or ovarian cancer, family members with bilateral or  multiple primary breast cancers, early onset breast cancer, female breast cancer, and Ashkenazi Jewish ancestry. It is estimated that 1 in 800 individuals carry a mutation in BRCA1 or BRCA2. However, 1 in 40 individuals of Ashkenazi Jewish ancestry have a BRCA mutation. Inheriting a mutation in BRCA1 or BRCA2 incurs a significantly increased risk of breast and ovarian cancer. In addition, the risk of a second primary breast cancer may be increased. Men who carry BRCA mutations also have an elevated risk of breast cancer as well as prostate cancer, although many female mutation carriers do not develop these diseases.  Other types of cancer have been seen in some families with BRCA mutations, including melanomas, and pancreatic cancers. We  reviewed that her young breast cancer and uterine carcinosarcoma could be reflective of this condition (case reports of UCS with BRCA2).      Lynn Tran came in specifically concerned about Lynch syndrome.  We discussed that Lynch syndrome (also referred to as hereditary non-polyposis colorectal cancer: HNPCC) is distinguished by the early onset of colorectal cancer in the absence of multiple colonic polyps. The age of onset of cancers in Lynn Tran syndrome families is generally in the fourth decade of life. The family history of an individual with Lynch syndrome usually consists of three or more family members in two or more generations with colon and endometrial cancer. Other cancers seen less commonly in families with Lynch syndrome can include stomach, ovarian, urinary tract, brain, sebaceous and pancreatic cancers. Lynch syndrome is caused by inherited mutations in the mismatch repair genes. The following four genes have been associated with Lynch syndrome to date: MLH1, MSH2, MSH6 and PMS2. Given her uterine pathology and that her most recent cancer was rectal, I am less suspicious of this condition, but panel testing can include coverage.    We discussed that the panel testing options include other moderate and high risk genes outside of BRCA or Lynch . Some of these are associated with well known syndromes (ex. PT53 with Maylon Peppers syndrome) with national guidelines for appropriate management. Others on the panel are associated with moderate cancer risks, but we lack standard screening guidelines at this time. In addition, we reviewed that the inconclusive rate increases with test size, but by casting a wide net, we have a larger chance of identifying a mutation that has an impact on familial cancer risk. She was very interested in pursuing the largest test at this time, despite limitations.     With this information, Lynn Tran decided to proceed with testing in the hopes of learning more about her cancer risk.   After providing informed consent**, a buccal sample for OvaNext at [facility] was taken. I will contact Lynn Tran in ~3 weeks with the results.     Plan  Lynn Tran will be contacted in ~3 weeks to review her genetic testing results.  At this time, we will have more information regarding her risks and recommended management, as well as a better risk estimate for family members' risks.    We informed Lynn Tran to stay in contact with [place] in the event that additional testing and research opportunities are available in the future.  She should also update [place] if there are any family history changes.    A total of 50 minutes was spent in our consultation today and 40 minutes charting and a family pedigree was created and is on file with the [place], [place], [facility]  Certified Genetic Counselor      **  Informed consent included a discussion of the following issues (as appropriate) with details noted above:   1. Information on the specific test being performed:   2. Implications of a positive and negative result:   3. Possibility that the test will not be informative   4. Options for risk estimation without genetic testing   5. Risk of passing a mutation to children   6. Technical accuracy of the test   7. Fees involved in testing   8. Psychological implications of tests results (benefits and risks)   9. Risks of insurance of employer discrimination   10. Confidentiality issues   11. Options and limitations of medical surveillance and strategies for prevention following testing   12. Importance of sharing genetic test results with at-risk relatives so that they may benefit from this information

## 2018-08-15 ENCOUNTER — Telehealth (HOSPITAL_BASED_OUTPATIENT_CLINIC_OR_DEPARTMENT_OTHER): Payer: Self-pay | Admitting: MS"

## 2018-08-15 DIAGNOSIS — Z1509 Genetic susceptibility to other malignant neoplasm: Principal | ICD-10-CM

## 2018-08-15 DIAGNOSIS — Z1501 Genetic susceptibility to malignant neoplasm of breast: Principal | ICD-10-CM

## 2018-08-15 NOTE — Telephone Encounter (Signed)
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   We had a discussion about this result and the implications for her own healthcare and screening. She was offered a consultation, but declined, so we talked in a bit more detail over the phone.    Our discussion included a review of the following risks:     Breast cancer risk:   We reviewed the management options for BRCA mutation carriers, including: mammogram and breast MRI as surveillance every year, typically beginning at age 23y; tamoxifen for risk-reduction, and the option of bilateral mastectomy with reconstruction. We also discussed that bilateral salpingo-oophorectomy prior to menopause (typically in the late 30s-early 40s) also offers a reduction in breast cancer risk.     Since her move to [place] does not have a go-to physician for her breast care.  She would like a referral to the Breast Clinic to coordinate increased surveillance with annual mammography and breast MRI, given her higher risk for a second primary breast cancer.    Ovarian cancer risk:   S/p TAH/BSO  Per patient, is overdue for pap smear    A referral will be placed to establish care with GynOnc given her previous cancer history and BRCA status.    Lynn Tran understands that her brother is at a 50% chance of carrying the same mutation.  BRCA mutations can also influence the risk of female breast cancer, prostate cancer, and pancreatic cancer.  It is unclear what side of the family this is coming from; her mother could pursue testing at [facility] for $50 by joining the Family Testing Program to determine where this was inherited from.  Given she is 63 years old, Lynn Tran may just relay the information to her (very few) living cousins she they can seek out genetic counseling.           Lynn Tran was provided with a copy of her test result, and she will be given the contact information for the national  [employer] group as well as http://[employer]/.     FOLLOWUP:   Patient will schedule an initial consultation in the Breast Clinic  Patient will schedule an initial consultation with GynOnc    Should [place] want to talk further and make a telehealth consultation, we can schedule that.  All questions were answered.    [name], MS, [place]

## 2018-08-15 NOTE — Telephone Encounter (Signed)
Left a voicemail for Lynn Tran in an effort to disclose her genetic test results.

## 2018-08-19 ENCOUNTER — Other Ambulatory Visit (INDEPENDENT_AMBULATORY_CARE_PROVIDER_SITE_OTHER): Payer: Self-pay | Admitting: Family Medicine

## 2018-08-19 MED ORDER — BUDESONIDE-FORMOTEROL FUMARATE 80-4.5 MCG/ACT IN AERO
INHALATION_SPRAY | RESPIRATORY_TRACT | 3 refills | Status: DC
Start: 2018-08-19 — End: 2018-09-23

## 2018-09-10 ENCOUNTER — Telehealth (HOSPITAL_BASED_OUTPATIENT_CLINIC_OR_DEPARTMENT_OTHER): Payer: Self-pay

## 2018-09-10 ENCOUNTER — Telehealth (INDEPENDENT_AMBULATORY_CARE_PROVIDER_SITE_OTHER): Payer: Self-pay | Admitting: Family Medicine

## 2018-09-10 NOTE — Telephone Encounter (Signed)
New pt referred to Gyn Onc received from Dr. Ronnald Ramp      DX: Lynn Tran 1 positive    Chart route to MD for review and triage

## 2018-09-11 NOTE — Telephone Encounter (Signed)
Patient has been scheduled for 10/27/18 at 11:30 with Katharine Look, NP

## 2018-09-17 ENCOUNTER — Encounter (INDEPENDENT_AMBULATORY_CARE_PROVIDER_SITE_OTHER): Payer: Self-pay

## 2018-09-17 ENCOUNTER — Ambulatory Visit (INDEPENDENT_AMBULATORY_CARE_PROVIDER_SITE_OTHER): Admitting: Family Medicine

## 2018-09-17 VITALS — HR 90 | Temp 98.3°F

## 2018-09-17 DIAGNOSIS — Z03818 Encounter for observation for suspected exposure to other biological agents ruled out: Secondary | ICD-10-CM

## 2018-09-17 DIAGNOSIS — Z7189 Other specified counseling: Principal | ICD-10-CM

## 2018-09-17 DIAGNOSIS — Z0184 Encounter for antibody response examination: Secondary | ICD-10-CM

## 2018-09-18 ENCOUNTER — Other Ambulatory Visit (INDEPENDENT_AMBULATORY_CARE_PROVIDER_SITE_OTHER): Payer: Self-pay | Admitting: Nurse Practitioner

## 2018-09-18 DIAGNOSIS — K219 Gastro-esophageal reflux disease without esophagitis: Principal | ICD-10-CM

## 2018-09-23 ENCOUNTER — Telehealth (INDEPENDENT_AMBULATORY_CARE_PROVIDER_SITE_OTHER): Payer: Self-pay | Admitting: Family Medicine

## 2018-09-23 MED ORDER — BUDESONIDE-FORMOTEROL FUMARATE 80-4.5 MCG/ACT IN AERO
2.0000 | INHALATION_SPRAY | Freq: Two times a day (BID) | RESPIRATORY_TRACT | 3 refills | Status: DC
Start: 2018-09-23 — End: 2019-09-28

## 2018-09-23 MED ORDER — OMEPRAZOLE 20 MG OR CPDR
20.0000 mg | DELAYED_RELEASE_CAPSULE | Freq: Every day | ORAL | 3 refills | Status: DC
Start: 2018-09-23 — End: 2019-07-08

## 2018-09-24 ENCOUNTER — Encounter (INDEPENDENT_AMBULATORY_CARE_PROVIDER_SITE_OTHER): Payer: Self-pay | Admitting: Family Medicine

## 2018-09-24 NOTE — Telephone Encounter (Signed)
Submitted PA to [facility]

## 2018-09-25 ENCOUNTER — Telehealth: Admitting: Family Medicine

## 2018-09-25 MED ORDER — CLOBETASOL PROPIONATE 0.05 % EX CREA
1.0000 | TOPICAL_CREAM | Freq: Two times a day (BID) | CUTANEOUS | 1 refills | Status: DC
Start: 2018-09-25 — End: 2019-11-16

## 2018-09-25 MED ORDER — CLOBETASOL PROPIONATE 0.05 % EX FOAM
CUTANEOUS | Status: DC
Start: ? — End: 2018-09-25

## 2018-09-25 NOTE — Telephone Encounter (Signed)
PA Approved for Symbicort generic. Please notify patient and pharmacy

## 2018-09-28 ENCOUNTER — Encounter (INDEPENDENT_AMBULATORY_CARE_PROVIDER_SITE_OTHER): Payer: Self-pay | Admitting: Family Medicine

## 2018-09-28 ENCOUNTER — Telehealth (INDEPENDENT_AMBULATORY_CARE_PROVIDER_SITE_OTHER): Payer: Self-pay | Admitting: Family Medicine

## 2018-10-05 ENCOUNTER — Encounter (INDEPENDENT_AMBULATORY_CARE_PROVIDER_SITE_OTHER): Payer: Self-pay | Admitting: Family Medicine

## 2018-10-24 NOTE — Progress Notes (Signed)
Demographics:  Date: October 29, 2018   Patient Name: Lynn Tran   Medical Record #: [id number]   DOB: 1955-12-22  Age: 63 year old  Sex: female    Requesting Physician:      Ofilia Neas    Reason for visit: No chief complaint on file.      Clinic Location:      [facility]  [address] [place] [address]      History of Present Illness:  Dunya, is a 63 yo female with a h/o BRCA 1 mutation, personal history of right sided breast cancer at age 26, s/p lumpectomy and radiation. She had a recurrence at age 74 leading to a right mastectomy, reconstruction and chemotherapy.  She was diagnosed with a carcinosarcoma of the uterus at age 29, s/p TAH/BSO- with radiation.     In addition, the patient has a newly diagnosed rectal cancer from 05/2018- S/p robotic assisted transanal excision of previous polypectomy site within the distal rectum that was noted to be positive for invasive adenocarcinoma on initial surgical pathology. The repeat full-thickness excision demonstrated no residual evidence of adenocarcinoma on final pathology.    The patient is being seen in clinic today to establish care at [facility]. Pt relocated back to [place] 2 years ago from [place].     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     Subjective: The patient looks and feels good. Pt is eating and drinking and moving her bowels. Declines: pelvic pain and vaginal bleeding. Admits to some diarrhea with tinge of blood,.+ swelling bilateral ankles with prolonged standing and the when it is hotter outside.       ROS:  Denies: pain, fever, chills,N/V, constipation CP, SOB, dizziness, lightheadedness , mood swings, hot flashes,  dysuria, rectal bleeding, black tarry stools and vaginal discharge and bleeding.      Gynecology history:   Age of menarche:63yo  Age of Menopause: 63 yo ( S/p tah/bso)  Pregnancies: G2, P0 ( h/o 1 molar pregnancy tx with  methotrexate and 1 ectopic pregnancy -  History of BCP's: Yes for a few months only   History of IUD:No  Hormone Use: No  History of STD's: No  Ever been tested for HIV or had any known exposures: Not sure   Last pap: 6 years - normal   History of Abnormal pap: No   Sexually active: Not at this time   Pain or bleeding with intercourse: No     Fmhx:   Immediate Family:    Her mother is 5 years old with a history of breast cancer at age 82 and a recurrence at age 31.  She underwent a TAH/BSO in her 68s and was diagnosed with melanoma on her thigh at age 69.  Her father passed away at age 20 from a heart attack.      Her brother is 26 years old.    Maternal Family:   She has three uncles who are deceased.  One had colon cancer in his 10s and the other died of Cushing syndrome in his 8s.  Garwin Brothers are alive and well.     Her grandfather died of tuberculosis at age 73.  Her grandmother lived to age 40.      Paternal Family:   Her aunt died at age 88 and was diagnosed with breast cancer in her 42s.  Her grandfather died in his 30s.  Her grandmother was diagnosed with breast cancer in her 1s and died at age 25.        Mammogram: 03/2018 - normal   Dexa scan: 10 years ago   Flexible sigmoidoscopy: 07-25-2018 No residual tumor or polyp noted        Surgical pathology 02-04-1997  Exploratory laparotomy, tah/bso, bilateral pelvic and peri-aortic lymph node sampling, omental bx  ( with Dr. Jeanine Luz)    - Carcinosarcoma,, lower uterine segment and endocervix  - No tumor identified in bilateral fallopian tubes, atrophy, bilateral ovaries  - Adenomyosis- myometrium  - Proliferative endometrium and polyp   - Dilatation and changes consistent with endometriosis   - No tumor identified omentum/ omental lymph node biopsy   - No tumor identified two pelvic lymph nodes, two right aortic lymph nodes, four right pelvic lymph   Nodes  - Squamous metaplasia, cervix excision         Allergies   Allergen Reactions   . Adhesive  Tape Rash   . Dmdm Hydantoin Hives   . Codeine Nausea and Vomiting   . Morphine Nausea and Vomiting       Current Outpatient Medications   Medication Sig   . albuterol (VENTOLIN HFA) 108 (90 Base) MCG/ACT inhaler Inhale 2 puffs by mouth 2 times daily.   . Biotin 1 MG CAPS    . budesonide-formoterol (SYMBICORT) 80-4.5 MCG/ACT inhaler Inhale 2 puffs by mouth 2 times daily.   . Cholecalciferol (VITAMIN D3) 1000 units tablet 4,000 Int'l Units.   Marland Kitchen CINNAMON PO    . clobetasol propionate (TEMOVATE) 0.05 % cream Apply 1 Application topically 2 times daily. Apply to affected area twice a day for recurrent contact dermatitis.   . Coenzyme Q10 (CO Q 10 PO)    . COLLAGEN PO    . Glucosamine-MSM-Hyaluronic Acd (JOINT HEALTH PO)    . ibuprofen (MOTRIN) 800 MG tablet Take 1 tablet (800 mg) by mouth every 8 hours as needed for Mild Pain (Pain Score 1-3). Refilled require visit.   Marland Kitchen KRILL OIL PO    . Milk Thistle 250 MG CAPS    . omeprazole (PRILOSEC) 20 MG capsule Take 1 capsule (20 mg) by mouth daily.     No current facility-administered medications for this visit.        Past Medical History:   Diagnosis Date   . Asthma    . BRCA gene mutation positive in female    . Breast cancer (CMS-HCC)     right   . Ectopic pregnancy    . Molar pregnancy     tx with metotrexate at age 16 yo    . Rectal cancer (CMS-HCC)    . Uterine cancer (CMS-HCC)        Past Surgical History:   Procedure Laterality Date   . h/o  S/p robotic assisted transanal excision of previous polypectomy site within the distal rectum  05/2018   . BREAST SURGERY      Lumpectomy at 63 yo followed by masectomy and reconstruction at age 29 for a recurrence   . H/o ectopic pregnancy     . H/o Tah Bso      1998   . TONSILLECTOMY AND ADENOIDECTOMY         Social History     Socioeconomic History   . Marital status: Separated     Spouse name: Not on file   . Number of children: Not on file   .  Years of education: Not on file   . Highest education level: Not on file    Occupational History   . Not on file   Tobacco Use   . Smoking status: Never Smoker   . Smokeless tobacco: Never Used   Substance and Sexual Activity   . Alcohol use: Yes     Frequency: 4 or more times a week     Drinks per session: 1 or 2   . Drug use: No   . Sexual activity: Not Currently   Social Activities of Daily Living Present   . Not on file   Social History Narrative   . Not on file       OB History   Gravida Para Term Preterm AB Living   2       1     SAB TAB Ectopic Multiple Live Births       1          # Outcome Date GA Lbr Len/2nd Weight Sex Delivery Anes PTL Lv   2 Gravida            1 Ectopic               Obstetric Comments   Pt states she had a molar pregnancy        Review of Systems - as above       Physical Examination:  BP 135/81 (BP Location: Left arm, BP Patient Position: Sitting, BP cuff size: Regular)   Pulse 88   Temp 97.8 F (36.6 C) (Temporal)   Resp 16   Ht '5\' 9"'  (1.753 m)   Wt 91.1 kg (200 lb 14.4 oz)   SpO2 100%   BMI 29.67 kg/m       General: Well-developed well-nourished pleasant lady  Lymphatic: lymph node survey negative  Heart: regular rate and rhythm  Lungs: clear to auscultation  Abdomen: soft nondistended nontender normal bowel sounds   Extremities: LE edema is negative  Pelvic exam: shows negative external genitalia, vagina clear, bimanual and  exam normal. Rectal deferred per patient request             ASSESSMENT/PLAN:  Anysia, is a 63 yo female with a h/o BRCA 1 mutation, personal history of right sided breast cancer at age 55, s/p lumpectomy and radiation. She had a recurrence at age 34 leading to a right mastectomy, reconstruction and chemotherapy.  She was diagnosed with a carcinosarcoma of the uterus at age 8, s/p TAH/BSO- with radiation.     In addition, the patient has has a newly diagnosed rectal cancer from 05/2018- S/p robotic assisted transanal excision of previous polypectomy site within the distal rectum that was noted to be positive for  invasive adenocarcinoma on initial surgical pathology. The repeat full-thickness excision demonstrated no residual evidence of adenocarcinoma on final pathology.    The patient is being seen in clinic today to establish care at [facility].        I had a long discussion with patient in clinic today regarding BRCA 1 mutation and carcinosarcoma of the uterus.     In terms of risk reducing measures the patient has already underwent -  tah/bso at age 44yo when she was diagnosed with her uterine cancer.    The patient was made aware she has a small  risk of developing  primary peritoneal cancer which is about 1.3%. At present, there is no effective screening test for primary peritoneal cancer.    Discussed  with patient she should establish care with breast oncology.  NCCN guidelines for BRCA mutation recommend clinical breast exam every 6-12 months with annual MRI and mammogram.    Pt to discuss possible prophylactic mastectomy of her left breast -   Continue skin checks with dermatology ( Fmhx mother- melanoma)    Pt has no s/s to suggest recurrent carcinosarcoma of the uterus.         Patient to follow up in 1 year or sooner if needed,.    CC: Ofilia Neas

## 2018-10-27 ENCOUNTER — Ambulatory Visit: Payer: Medicaid HMO | Attending: Clinical Genetics (M.D.) | Admitting: Nurse Practitioner

## 2018-10-27 ENCOUNTER — Encounter (HOSPITAL_BASED_OUTPATIENT_CLINIC_OR_DEPARTMENT_OTHER): Payer: Self-pay | Admitting: Nurse Practitioner

## 2018-10-27 VITALS — BP 135/81 | HR 88 | Temp 97.8°F | Resp 16 | Ht 69.0 in | Wt 200.9 lb

## 2018-10-27 DIAGNOSIS — Z1501 Genetic susceptibility to malignant neoplasm of breast: Secondary | ICD-10-CM | POA: Insufficient documentation

## 2018-10-27 DIAGNOSIS — Z923 Personal history of irradiation: Secondary | ICD-10-CM

## 2018-10-27 DIAGNOSIS — Z1509 Genetic susceptibility to other malignant neoplasm: Secondary | ICD-10-CM | POA: Insufficient documentation

## 2018-10-27 DIAGNOSIS — Z8542 Personal history of malignant neoplasm of other parts of uterus: Secondary | ICD-10-CM | POA: Insufficient documentation

## 2018-10-27 NOTE — Patient Instructions (Addendum)
1. You have been referred to the [facility] for a medical oncology consult. You may call to schedule your appointment at [phone]  2. Follow up with cancer genetics for in person consult   3. Annual follow up visits    We strive to provide a very good patient experience from compassion and caring to courtesy. Please tell us about your experience by completing your patient satisfaction survey that you may receive in the mail. We want to hear from you and are dedicated to gaining your loyalty.      Thank you for allowing [facility] to participate in your healthcare!             [facility] Phone List for Patients    Clinic Hours of Operation: Monday-Friday 8:00- 5:00p.m. (closed holidays and weekends)  Infusion Center Hours: Monday-Friday 7:00-7:30 p.m.; Sat-Sun 8:00-4:30pm.  [facility] (In-patient Unit, [facility]) Hours: Open 24 hours     AFTER HOURS EMERGENCY NUMBER    [phone]     **Ask for the GynecologyOncology Physician On-Call.      Physician: Altamease Oiler, MD    Nurse Practitioner: Katharine Look  Phone#    [phone]     Fax# [phone]  Pager#    [phone]  **For medication refills please have your pharmacy fax a request to the # above.       Administrative Assistant: Marlowe Sax AA  Phone#    [phone]     Fax# [phone]  **Please call prior to coming in to pick up forms or letters to insure their availability; as they take 24-48 hours to prepare.     Social Worker: Leanna Battles  LCS  Phone # [phone]  For emotional, social, spiritual and practical needs that may arise throughout cancer treatment and recovery.    Information Desk [phone]  ** General information: directions, telephone numbers, or available services.    [facility] [phone]  **Call to schedule, cancel, or reschedule clinic appointments for Dr. Remo Lipps [place]    Infusion Center [phone]   ** Call to  schedule, cancel, or reschedule chemotherapy appointments.    Procedure Suite [phone]    Radiation Oncology [phone]  ** Call to schedule, cancel, or reschedule radiation appointments.    Clinical Trials Office [phone]    [facility], [facility]   [phone]

## 2018-10-28 ENCOUNTER — Telehealth (HOSPITAL_BASED_OUTPATIENT_CLINIC_OR_DEPARTMENT_OTHER): Payer: Self-pay

## 2018-10-28 NOTE — Telephone Encounter (Signed)
Called pt in regards to internal referral for breast clinic   Brca + from genetics.     No answer left a msg with call back #

## 2018-10-29 NOTE — Telephone Encounter (Signed)
NP - BRCA +   Scheduled 6/16 @ 200pm with NP Charna Archer.     INTERNAL RECORDS     Pt informed of apt details and VP.     *NOTE: pt mentioned she is interested in breast surgery due to BRCA gene. Requesting Dr. Juleen China

## 2018-10-31 ENCOUNTER — Encounter (INDEPENDENT_AMBULATORY_CARE_PROVIDER_SITE_OTHER): Payer: Self-pay | Admitting: Family Medicine

## 2018-11-09 NOTE — Interdisciplinary (Signed)
CLINICIAN SUMMARY REPORT     Participant: Lynn Tran  Clinician: No Response  Appointment: No Response No Response  Questionnaire: No Response 11/09/2018 10:16 AM  Information reported by patient on 11/09/2018 10:16 AM             PERSONAL SUMMARY    Gender: Female   Age: 63  Race: White   Marital Status: Divorced  Education: [employer], [employer]    SYMPTOMS    Symptoms in the past week: Feeling bloated : Unhappy with appearance of my   body : Swelling of arms or legs : Constipation : Weight gain : Feeling   irritable : [place] HISTORY    Reason for a visit: Family history or genetic risk of breast cancer : Other  Last mammogram:  Less than 1 year ago  Location: [place], [place]  History of Invasive Breast Cancer: No    History of BC - type unknown: No  History of DCIS Diagnosis: Yes      * Location of Dx: Right breast      * Age: 33    Triple negative: No      Right Breast History:      * Symptoms (last 3 months): Other      * Symptoms (present today): No, I haven't noticed any changes      * Procedures: Fine Needle Aspiration (FNA) : Implants : Breast   reconstruction : Radiation Therapy : Mastectomy : Lumpectomy for cancer :   Surgical biopsy : Core biopsy  Left Breast History:      * Symptoms (last 3 months): No, I haven't noticed any changes      * Symptoms (present today): N/A      * Procedures: Fine Needle Aspiration (FNA) : Surgical biopsy : Core   biopsy      Number of breast biopsies: More than 1      Atypia:  Do not know      LCIS:  Don't know      GENETIC TESTING    Person/Family Genetic Testing: Yes - I have Myself - BRCA1,  Relatives -   N/A               PAST MEDICAL HISTORY                                    Current conditions: None                  Condition history: Being Overweight (Obesity) : Lung Disease (e.g., asthma,   pulmonary fibrosis, etc.) : Cancer (other than breast cancer)                    Number of Comorbidities: 3                  Cancer history: Breast  Cancer : Uterine (non-Cervical) / Endometrial Cancer   : Colon, Rectal, Large Intestine Cancer    GYNECOLOGICAL HISTORY    Age of first menstruation: 12           Has menstruation stopped: Yes - Uterus AND both ovaries removed by surgery             Hormonal birth control: Ever Taken:Yes - to prevent pregnancy  Yrs Taken: 1       Hormones ever taken: No - I have  never been on any hormone therapy  Hormones currently taking: N/A  Pregnancies:      * G: 0;      * P: N/A;      * Age at first birth: N/A       Future fertility: No       Fertility preservation: N/A       Interested in onco-fertility: N/A    FAMILY HISTORY    Adopted: No                 Twin: N/A  Mother:       * BC: 68       * Bilateral: No                  Sister(s): No                      Daughter(s): No                                Maternal Grandmother: No                             Paternal Grandmother:       * BC: 21       * Bilateral: No                       Maternal Aunt(s): No                               Paternal Aunt(s):       * BC: 1, 75      * Bilateral: No                Relatives with ovarian cancer: No               Jewish Ancestry: Don't know                       QUALITY OF LIFE    LACE/PROMIS Physical Function Trigger: Patient reports physical impairment  PROMIS Depression:      * Worthless: Never      * Helpless: Rarely      * Depressed: Rarely      * Hopeless: Rarely  PROMIS Anxiety:      * Fearful: Never      * Difficulty focusing: Rarely      * Overwhelmed by worries: Rarely      * Uneasy: Rarely  PROMIS Sleep Impairment/Disturbance Trigger: No sleep impairment  PROMIS Fatigue Trigger: No fatigue    PROMIS PHYSICAL FUNCTION                        Limitations in regular activities: A little bit  Difficulty writing or handling small objects: None  Difficulty reaching or extending RIGHT arm above shoulder: None  Difficulty reaching or extending LEFT arm above shoulder: None  Difficulty standing in place for 15 minutes or longer:  None  Difficulty sitting for long periods: None  Difficulty standing up after sitting in a chair: None  Difficulty getting up from stooping/crouching/kneeling: A little  Able to do chores such as vacuuming or yard work: Without any difficulty  Able to push open a heavy door: Without  any difficulty  Able to dress self: Without any difficulty  Able to wash back: Without any difficulty  Able to dry back with a towel: Without any difficulty  Able to sit on the edge of a bed: Without any difficulty  Able to wash and dry body: Without any difficulty  Able to get in and out of a car: Without any difficulty  Able to squeeze a new tube of toothpaste: Without any difficulty  Able to hold a plate full of food: Without any difficulty  Able to run a short distance: Without any difficulty  Able to shampoo your hair: Without any difficulty  Able to get on and off the toilet: Without any difficulty  Able to transfer from a bed to a chair and back: Without any difficulty  Difficulty getting up from stooping/crouching/kneeling: With a little   difficulty  Limited in doing vigorous activities: Very little  Limited in bending, kneeling, or stooping: Very little  Limited in lifting or carrying groceries: Not at all  Limited in doing two hours of physical labor: Very little  Limited in walking more than a mile: Not at all  Limited in climbing one flight of stairs: Very little      SOCIAL HISTORY    Satisfaction with social support: Quite a bit  Working status: Retired (not due to ill health); N/A  Tobacco use: No  Drinking frequency: 4 or more times per WEEK  Exercise:      * Walking: 2 - 3 times each week; 20 - 39 minutes; Average or normal   (2-3 miles an hour)                 * Strenuous exercise: none; N/A                                       * Moderate exercise: none; N/A                         * Mild exercise: none; N/A                     BMI: 29.5; 200 lbs: 5 feet   9 inches

## 2018-11-09 NOTE — Interdisciplinary (Signed)
CLINICIAN SUMMARY REPORT     Participant: Lynn Tran  Clinician: No Response  Appointment: No Response No Response  Questionnaire: No Response 11/09/2018 10:16 AM  Information reported by patient on 11/09/2018 10:16 AM             PERSONAL SUMMARY    Gender: Female   Age: 63  Race: White   Marital Status: Divorced  Education: [employer], [employer] school    SYMPTOMS    Symptoms in the past week: Feeling bloated : Unhappy with appearance of my   body : Swelling of arms or legs : Constipation : Weight gain : Feeling   irritable : [place] HISTORY    Reason for a visit: Family history or genetic risk of breast cancer : Other  Last mammogram:  Less than 1 year ago  Location: [place], [place]  History of Invasive Breast Cancer: No    History of BC - type unknown: No  History of DCIS Diagnosis: Yes      * Location of Dx: Right breast      * Age: 8    Triple negative: No      Right Breast History:      * Symptoms (last 3 months): Other      * Symptoms (present today): No, I haven't noticed any changes      * Procedures: Fine Needle Aspiration (FNA) : Implants : Breast   reconstruction : Radiation Therapy : Mastectomy : Lumpectomy for cancer :   Surgical biopsy : Core biopsy  Left Breast History:      * Symptoms (last 3 months): No, I haven't noticed any changes      * Symptoms (present today): N/A      * Procedures: Fine Needle Aspiration (FNA) : Surgical biopsy : Core   biopsy      Number of breast biopsies: More than 1      Atypia:  Do not know      LCIS:  Don't know      GENETIC TESTING    Person/Family Genetic Testing: Yes - I have Myself - BRCA1,  Relatives -   N/A               PAST MEDICAL HISTORY                                    Current conditions: None                  Condition history: Being Overweight (Obesity) : Lung Disease (e.g., asthma,   pulmonary fibrosis, etc.) : Cancer (other than breast cancer)                    Number of Comorbidities: 3                  Cancer history: Breast  Cancer : Uterine (non-Cervical) / Endometrial Cancer   : Colon, Rectal, Large Intestine Cancer    GYNECOLOGICAL HISTORY    Age of first menstruation: 12           Has menstruation stopped: Yes - Uterus AND both ovaries removed by surgery             Hormonal birth control: Ever Taken:Yes - to prevent pregnancy  Yrs Taken: 1       Hormones ever taken: No - I have  never been on any hormone therapy  Hormones currently taking: N/A  Pregnancies:      * G: 0;      * P: N/A;      * Age at first birth: N/A       Future fertility: No       Fertility preservation: N/A       Interested in onco-fertility: N/A    FAMILY HISTORY    Adopted: No                 Twin: N/A  Mother:       * BC: 27       * Bilateral: No                  Sister(s): No                      Daughter(s): No                                Maternal Grandmother: No                             Paternal Grandmother:       * BC: 63       * Bilateral: No                       Maternal Aunt(s): No                               Paternal Aunt(s):       * BC: 1, 58      * Bilateral: No                Relatives with ovarian cancer: No               Jewish Ancestry: Don't know                       QUALITY OF LIFE    LACE/PROMIS Physical Function Trigger: Patient reports physical impairment  PROMIS Depression:      * Worthless: Never      * Helpless: Rarely      * Depressed: Rarely      * Hopeless: Rarely  PROMIS Anxiety:      * Fearful: Never      * Difficulty focusing: Rarely      * Overwhelmed by worries: Rarely      * Uneasy: Rarely  PROMIS Sleep Impairment/Disturbance Trigger: No sleep impairment  PROMIS Fatigue Trigger: No fatigue    PROMIS PHYSICAL FUNCTION                        Limitations in regular activities: A little bit  Difficulty writing or handling small objects: None  Difficulty reaching or extending RIGHT arm above shoulder: None  Difficulty reaching or extending LEFT arm above shoulder: None  Difficulty standing in place for 15 minutes or longer:  None  Difficulty sitting for long periods: None  Difficulty standing up after sitting in a chair: None  Difficulty getting up from stooping/crouching/kneeling: A little  Able to do chores such as vacuuming or yard work: Without any difficulty  Able to push open a heavy door: Without  any difficulty  Able to dress self: Without any difficulty  Able to wash back: Without any difficulty  Able to dry back with a towel: Without any difficulty  Able to sit on the edge of a bed: Without any difficulty  Able to wash and dry body: Without any difficulty  Able to get in and out of a car: Without any difficulty  Able to squeeze a new tube of toothpaste: Without any difficulty  Able to hold a plate full of food: Without any difficulty  Able to run a short distance: Without any difficulty  Able to shampoo your hair: Without any difficulty  Able to get on and off the toilet: Without any difficulty  Able to transfer from a bed to a chair and back: Without any difficulty  Difficulty getting up from stooping/crouching/kneeling: With a little   difficulty  Limited in doing vigorous activities: Very little  Limited in bending, kneeling, or stooping: Very little  Limited in lifting or carrying groceries: Not at all  Limited in doing two hours of physical labor: Very little  Limited in walking more than a mile: Not at all  Limited in climbing one flight of stairs: Very little      SOCIAL HISTORY    Satisfaction with social support: Quite a bit  Working status: Retired (not due to ill health); N/A  Tobacco use: No  Drinking frequency: 4 or more times per WEEK  Exercise:      * Walking: 2 - 3 times each week; 20 - 39 minutes; Average or normal   (2-3 miles an hour)                 * Strenuous exercise: none; N/A                                       * Moderate exercise: none; N/A                         * Mild exercise: none; N/A                     BMI: 29.5; 200 lbs: 5 feet   9 inches

## 2018-11-10 NOTE — Goals of Care (Deleted)
Advance Care Planning    What gives the patient's life meaning?      Patient would be willing to endure aggressive medical therapies as long as they could still:       Who would make medical decisions for the patient if they are unable to make decisions for themselves?   [phone] , Friend     Based on above information I recommended the following:  {ACP recommendation:19804}    Total time spent face-to-face with patient and/or surrogate decision maker providing counseling related to advance care planning:   5 minutes

## 2018-11-10 NOTE — Interdisciplinary (Deleted)
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    MST Screening Questions:   Have you recently lost weight without trying?   If yes, how much weight have you lost?  Have you been eating poorly because of a decreased appetite?     Advance Care Planning Questions   What gives your life meaning?  Who would make medical decisions for the patient if they are unable to make decisions for themselves?     History:  Menarche:12  MVH:QIONG hysterectomy at 82  Pregnancies: 2  Children:0  Age at first birth:n/a  HX OBCP:yes for 1 year before stopping   HX HRT:No   Breast Implants/Type:  Smoking:no  Alcohol:4+/week   Currently working?:retired   Ashkenazi Ancestry?Unsure   Genetic Testing?[facility] 08/07/2018, BRCA1 positive   Med HX: DCIS, Uterine [place], Rectal [place]?    Family History of Breast Cancer:  Mother, 22  Paternal Grandmother, 33   Paternal 77, 30    Family History of Cancer:  None     Medications, Medical History, Surgical History, and allergies updated and reviewed in EPIC    Plan:

## 2018-11-11 ENCOUNTER — Ambulatory Visit (HOSPITAL_BASED_OUTPATIENT_CLINIC_OR_DEPARTMENT_OTHER): Payer: Medicaid HMO | Admitting: Nurse Practitioner

## 2018-11-11 NOTE — Interdisciplinary (Deleted)
Consultation with NP Nance Pew Tuesday 11/10/2018 for High Risk Screening     H/O: BRCA1+, Right Breast DCIS at 11 S/P lumpectomy and radiation, Right Breast recurrence at 83 S/P Mastectomy, recon, and chemo     Assessment:  Any pain, skin changes, nipple discharge?    MST Screening Questions:   Have you recently lost weight without trying?   If yes, how much weight have you lost?  Have you been eating poorly because of a decreased appetite?     Advance Care Planning Questions   What gives your life meaning?  Who would make medical decisions for the patient if they are unable to make decisions for themselves?     History:  Menarche:12  HYW:VPXTG hysterectomy at 12  Pregnancies: 2  Children:0  Age at first birth:n/a  HX OBCP:yes for 1 year before stopping   HX HRT:No   Breast Implants/Type:  Smoking:no  Alcohol:4+/week   Currently working?:retired   Ashkenazi Ancestry?Unsure   Genetic Testing?[facility] 08/07/2018, BRCA1 positive   Med HX: DCIS, Uterine [place], Rectal ?    Family History of Breast Cancer:  Mother, 6  Paternal Grandmother, 76   Paternal 41, 63    Family History of Cancer:  None     Medications, Medical History, Surgical History, and allergies updated and reviewed in EPIC    Plan:

## 2018-11-19 NOTE — Telephone Encounter (Signed)
Called pt regarding insurance. Apt had been canceled due to [employer] insurance approval with new IPA.     Informed pt insurance is not in network. Redirected to [facility] clinic provided pt # given by insurance.     Pt was thankful for the update

## 2018-11-20 ENCOUNTER — Ambulatory Visit (HOSPITAL_BASED_OUTPATIENT_CLINIC_OR_DEPARTMENT_OTHER): Payer: Medicaid HMO | Admitting: Nurse Practitioner

## 2019-02-04 ENCOUNTER — Telehealth (INDEPENDENT_AMBULATORY_CARE_PROVIDER_SITE_OTHER): Admitting: Physician Assistant

## 2019-02-04 ENCOUNTER — Encounter (INDEPENDENT_AMBULATORY_CARE_PROVIDER_SITE_OTHER): Payer: Self-pay | Admitting: Physician Assistant

## 2019-02-04 MED ORDER — IBUPROFEN 800 MG OR TABS
800.0000 mg | ORAL_TABLET | Freq: Three times a day (TID) | ORAL | 0 refills | Status: AC | PRN
Start: 2019-02-04 — End: ?

## 2019-02-04 MED ORDER — DICLOFENAC SODIUM 1 % EX GEL
4.0000 g | Freq: Four times a day (QID) | TRANSDERMAL | 1 refills | Status: AC
Start: 2019-02-04 — End: ?

## 2019-02-04 NOTE — Progress Notes (Signed)
[facility] Outpatient Note    Subjective: Lynn Tran is a 63 year old female with hx of chronic left knee pain secondary to torn meniscus of left knee, presenting to [facility] for Telemedicine visit for Prescription refill. Telemedicine visit completed due to Covid19 Pandemic. Patient understands limitation of telemedicine visit and consents to treatment.      Patient desires prescription refill of Ibuprofen 800 mg. She uses this PRN for flare ups of her chronic left knee pain. Two days ago, patient slipped on water and had a sudden pivot in her knee. She caught herself before falling. The patient denies hearing any audible pop/click. There was no pain initially. Pain began gradually the next morning. Localized to her anterior tibia, below her left knee. Pain only occurs with weight bearing. No pain at rest. She has mild swelling. No erythema, skin changes, skin wounds, bleeding, or ecchymosis. She has hx of osteoporosis, but no hx of fractures. Patient follows Orthopedics Dr. Ilean Skill. Completed 6 shot injection therapy treatment one year ago. Recommended to have left knee replacement. Patient denies icing or knee. Feels well overall- no fever, fatigue, chills, sweats, bloody stools, abdominal pain, diarrhea, dizziess.     Review of Systems: As per HPI     Current Outpatient Medications on File Prior to Visit   Medication Sig Dispense Refill   . albuterol (VENTOLIN HFA) 108 (90 Base) MCG/ACT inhaler Inhale 2 puffs by mouth 2 times daily. 1 Inhaler 3   . Biotin 1 MG CAPS      . budesonide-formoterol (SYMBICORT) 80-4.5 MCG/ACT inhaler Inhale 2 puffs by mouth 2 times daily. 30.6 g 3   . Cholecalciferol (VITAMIN D3) 1000 units tablet 4,000 Int'l Units.     Marland Kitchen CINNAMON PO      . clobetasol propionate (TEMOVATE) 0.05 % cream Apply 1 Application topically 2 times daily. Apply to affected area twice a day for recurrent contact dermatitis. 30 g 1   . Coenzyme Q10 (CO Q 10 PO)      . COLLAGEN PO      .  Glucosamine-MSM-Hyaluronic Acd (JOINT HEALTH PO)      . [DISCONTINUED] ibuprofen (MOTRIN) 800 MG tablet Take 1 tablet (800 mg) by mouth every 8 hours as needed for Mild Pain (Pain Score 1-3). Refilled require visit. 30 tablet 0   . KRILL OIL PO      . Milk Thistle 250 MG CAPS      . omeprazole (PRILOSEC) 20 MG capsule Take 1 capsule (20 mg) by mouth daily. 90 capsule 3     No current facility-administered medications on file prior to visit.      Immunization History   Administered Date(s) Administered   . (Shingles) Herpes Zoster Vaccine (ZOSTAVAX) 11/01/2016   . Influenza Vaccine (Unspecified) 02/26/2011, 02/26/2012, 03/12/2013   . Influenza Vaccine >=6 Months 03/29/2014, 03/18/2015, 03/06/2018   . Pneumococcal 23 Vaccine (PNEUMOVAX-23) 04/13/2014   . Tdap 04/13/2014     Allergies   Allergen Reactions   . Adhesive Tape Rash   . Dmdm Hydantoin Hives   . Codeine Nausea and Vomiting   . Morphine Nausea and Vomiting     Patient Active Problem List    Diagnosis Date Noted   . FH: melanoma 09/28/2018     09/25/2018 - Followed by Dermatology in past, now overdue for f/u. Referral placed.     Marland Kitchen BRCA1 positive 09/28/2018     09/25/2018 - recent diagnosis after seeing genetic counselor. Plan to see GynOnc and Breast  Clinic     . Encounter to establish care 09/28/2018     09/25/2018 - Records from Dr. Rockne Coons, DO (previous PCP) requested. To have immunization records input.     . Adverse effect of bisphosphonates 09/28/2018     09/25/2018 - patient reports took for 7-8 years but complicated by bone spurs in mouth and no longer taking. Needs DEXA when COVID-19 pandemic allows.     . Vitamin D deficiency 09/28/2018     09/25/2018 - taking 4000 IU, to check level at July annual     . Elevated glucose 09/28/2018     09/25/2018 - noted on chart review; A1c in July     . History of cancer 07/16/2018     07/14/2018 - Referral to Genetics for "Multiple cancers: breast (twice), uterine, colon" [diagnosed with BRCA1)\]     . Acute  urticaria 07/16/2018     07/14/2018 - Very likely from medication during recent colon cancer surgery (ancef vs opioid) 10 days ago. Discussed that could end up being chronic (6+weeks) and may need labs and Allergy referral; low suspicion this will occur. Discussion of prednisone risks and together plan was formulated through shared decision making. The following discussed with patient and written into AVS: "- Daily zyrtec (ok to increase to twice daily).   - Ok for benadryl 25-50mg  at night for itching  - famotidine 20mg  twice daily  - Wait 1-2 days and if above measures aren't helping start the prednisone. Take 60mg  daily for 5-7 days, tapering is option  - We decided to hold on Allergy referral for now"   09/25/2018 - Resolved, discussed that as significant possibility hives from antibiotics eventual plan for Allergy referral.     . Chronic pain of left knee 07/16/2018     07/14/2018 - Asking for ibuprofen 800mg  for anticipated episodes severe pain. Not evaluated fully. #30 prescribed. Discussed risks, refills require more discussion (visit)     . History of rectal cancer 06/26/2018     07/14/2018 - Recent surgery for this 07/04/2018 with f/u 3 weeks from then with Dr. Vassie Moment  09/25/2018 - Patient preferring to work with different surgeon, new referral placed     . GERD (gastroesophageal reflux disease) 09/18/2017   . Mild persistent asthma without complication 09/18/2017     09/25/2018 - Lifelong diagnosis since age 41. Very stable on current medications. PF recommended today. Rare albuterol use. To need pneumovax    Medications: Symbicort 80, albuterol prn     . Contact dermatitis 09/18/2017     09/25/2018 - very extensive history here with multiple opinions and finally with patch testing with delayed result confirming DMDM hydantoin as causal agent. Has used clobetasol foam for mild recurrences if accidentally exposed; prescription today for cream as cheaper likely.     Marland Kitchen Herpes simplex type 1 infection 09/18/2017      09/25/2018 - takes intermittent Valtrex for cold sores.     . History of torn meniscus of left knee 09/18/2017   . History of breast cancer 09/18/2017     Diagnosed at 27,  Had a lumpectomy and radiaton.   Came back 11 years later, got R sided mastectomy with reconstruction and chemotherapy.     Marland Kitchen History of uterine cancer 09/18/2017     High grade uterine cancer. 1998, total hysterectomy at that time and radiation   10/29/18 - Annual follow-up with Gynecology Oncology (next due June 2021). Needs to have pelvic examination yearly as she still  has a small risk of developing a primary peritoneal cancer or a recurrence of her uterine cancer.        Past Medical History:   Diagnosis Date   . Asthma    . BRCA gene mutation positive in female    . Breast cancer (CMS-HCC)     right   . Ectopic pregnancy    . Molar pregnancy     tx with metotrexate at age 37 yo    . Rectal cancer (CMS-HCC)    . Screening for cervical cancer 09/28/2018    09/25/2018 - s/p hysterectomy for uterine cancer; defer to GynOnc re: if pap needed though unlikely. 10/29/18 - message to Oswaldo Milian re: confirm no longer needing paps with h/o squamous metaplasia on cervical pathology 10/31/18 - no longer needs paps confirmed.   Marland Kitchen Uterine cancer (CMS-HCC)      Past Surgical History:   Procedure Laterality Date   . h/o  S/p robotic assisted transanal excision of previous polypectomy site within the distal rectum  05/2018   . BREAST SURGERY      Lumpectomy at 63 yo followed by masectomy and reconstruction at age 70 for a recurrence   . H/o ectopic pregnancy     . H/o [place]      1998   . TONSILLECTOMY AND ADENOIDECTOMY       Social History     Socioeconomic History   . Marital status: Separated     Spouse name: Not on file   . Number of children: Not on file   . Years of education: Not on file   . Highest education level: Not on file   Occupational History   . Not on file   Social Needs   . Financial resource strain: Not on file   . Food insecurity       Worry: Not on file     Inability: Not on file   . Transportation needs     Medical: Not on file     Non-medical: Not on file   Tobacco Use   . Smoking status: Never Smoker   . Smokeless tobacco: Never Used   Substance and Sexual Activity   . Alcohol use: Yes     Frequency: 4 or more times a week     Drinks per session: 1 or 2   . Drug use: No   . Sexual activity: Not Currently   Lifestyle   . Physical activity     Days per week: Not on file     Minutes per session: Not on file   . Stress: Not on file   Relationships   . Social Wellsite geologist on phone: Not on file     Gets together: Not on file     Attends religious service: Not on file     Active member of club or organization: Not on file     Attends meetings of clubs or organizations: Not on file     Relationship status: Not on file   . Intimate partner violence     Fear of current or ex partner: Not on file     Emotionally abused: Not on file     Physically abused: Not on file     Forced sexual activity: Not on file   Other Topics Concern   . Not on file   Social History Narrative   . Not on file       Objective:  There were no vitals filed for this visit.  There is no height or weight on file to calculate BMI.    Wt Readings from Last 5 Encounters:   10/27/18 91.1 kg (200 lb 14.4 oz)   09/18/17 87.5 kg (193 lb)     Blood Pressure   10/27/18 135/81   07/14/18 126/82   09/18/17 128/81       Physical Exam: Limited due to Telemedicine encounter.   Gen: in NAD, A&O, pleasant, non-toxic appearing  HEENT: NC/AT, No icterus, ptosis.   Lungs: Breathing comfortably on RA  Neurologic: Mentation appropriate.   Skin: No rashes or lesions on observed areas.    MUSC: Left knee- no obvious deformity. Non-tender to palpation.     ASSESSMENT AND PLAN:  1. Acute pain of left knee   63 year old female with hx of chronic left knee pain secondary to torn meniscus of left knee, presenting to [facility] for Telemedicine visit for Prescription refill of Ibuprofen 800 mg due to  acute exacerbation of anterior knee pain x 2 days s/p twisting injury.  Recommended rest, ice, LE elevation, and topical voltaren gel for relief.  Use 800 mg Ibuprofen (with food and water) if pain persists:   - ibuprofen (MOTRIN) 800 MG tablet; Take 1 tablet (800 mg) by mouth every 8 hours as needed for Mild Pain (Pain Score 1-3). Refilled require visit.  Dispense: 30 tablet; Refill: 0  - diclofenac (VOLTAREN) 1 % gel; Apply 4 g topically 4 times daily. Use the plastic dose guide found inside the box to measure the dose.  Dispense: 1 Tube; Refill: 1    Follow up  F/u in the office if symptoms do not improve, worsen, or if patient has any concerns.      Geannie Risen, PA   [facility]

## 2019-03-04 ENCOUNTER — Other Ambulatory Visit (INDEPENDENT_AMBULATORY_CARE_PROVIDER_SITE_OTHER): Payer: Self-pay | Admitting: Nurse Practitioner

## 2019-03-13 ENCOUNTER — Other Ambulatory Visit (INDEPENDENT_AMBULATORY_CARE_PROVIDER_SITE_OTHER): Payer: Self-pay | Admitting: Family Medicine

## 2019-03-13 MED ORDER — ALBUTEROL SULFATE 108 (90 BASE) MCG/ACT IN AERS
2.00 | INHALATION_SPRAY | Freq: Four times a day (QID) | RESPIRATORY_TRACT | 11 refills | Status: DC | PRN
Start: 2019-03-13 — End: 2019-11-16

## 2019-03-16 ENCOUNTER — Telehealth (INDEPENDENT_AMBULATORY_CARE_PROVIDER_SITE_OTHER): Payer: Self-pay | Admitting: Family Medicine

## 2019-03-31 ENCOUNTER — Encounter (INDEPENDENT_AMBULATORY_CARE_PROVIDER_SITE_OTHER): Payer: Self-pay

## 2019-04-10 ENCOUNTER — Encounter (INDEPENDENT_AMBULATORY_CARE_PROVIDER_SITE_OTHER): Payer: Self-pay

## 2019-04-28 ENCOUNTER — Encounter (INDEPENDENT_AMBULATORY_CARE_PROVIDER_SITE_OTHER): Payer: Self-pay

## 2019-07-08 ENCOUNTER — Other Ambulatory Visit (INDEPENDENT_AMBULATORY_CARE_PROVIDER_SITE_OTHER): Payer: Self-pay | Admitting: Family Medicine

## 2019-07-08 MED ORDER — OMEPRAZOLE 20 MG OR CPDR
DELAYED_RELEASE_CAPSULE | ORAL | 3 refills | Status: DC
Start: 2019-07-08 — End: 2019-11-16

## 2019-08-14 ENCOUNTER — Encounter (INDEPENDENT_AMBULATORY_CARE_PROVIDER_SITE_OTHER): Payer: Self-pay | Admitting: Hospital

## 2019-08-14 ENCOUNTER — Encounter (INDEPENDENT_AMBULATORY_CARE_PROVIDER_SITE_OTHER): Payer: Self-pay

## 2019-09-25 ENCOUNTER — Telehealth (INDEPENDENT_AMBULATORY_CARE_PROVIDER_SITE_OTHER): Payer: Self-pay | Admitting: Physician Assistant

## 2019-09-25 NOTE — Telephone Encounter (Signed)
 [facility] Provider FYI (no action needed)     Previous Telephone/MyChart encounter created for same issue?  No    Who's calling?: pt.    The call is regarding:     Pt is calling about getting a referral to [facility]. I informed her that she would need a visit with a provider in order for us  to put in a new referral (her last visit was 09/20). Pt said she would try calling "[facility]" first and seeing if she can make an appt because she had been with them before.    This is just an FYI, no action is required.    Best callback number (if clarification is needed): NA    No future appointments.    Meg Niemeier M Joclyn Alsobrook

## 2019-09-28 ENCOUNTER — Other Ambulatory Visit (INDEPENDENT_AMBULATORY_CARE_PROVIDER_SITE_OTHER): Payer: Self-pay | Admitting: Family Medicine

## 2019-09-28 MED ORDER — BUDESONIDE-FORMOTEROL FUMARATE 80-4.5 MCG/ACT IN AERO
INHALATION_SPRAY | RESPIRATORY_TRACT | 3 refills | Status: AC
Start: 2019-09-28 — End: ?

## 2019-09-28 NOTE — Telephone Encounter (Signed)
 Called patient and informed her on the refill. Scheduled a follow up in office on 6/21

## 2019-09-28 NOTE — Telephone Encounter (Signed)
 Refilled. Patient due for follow-up, telemedicine visit ok, please arrange, next available.

## 2019-10-05 ENCOUNTER — Ambulatory Visit (INDEPENDENT_AMBULATORY_CARE_PROVIDER_SITE_OTHER): Admitting: Physician Assistant

## 2019-10-05 ENCOUNTER — Telehealth (INDEPENDENT_AMBULATORY_CARE_PROVIDER_SITE_OTHER): Payer: Self-pay

## 2019-10-05 ENCOUNTER — Encounter (INDEPENDENT_AMBULATORY_CARE_PROVIDER_SITE_OTHER): Payer: Self-pay | Admitting: Physician Assistant

## 2019-10-05 VITALS — BP 122/80 | HR 88 | Temp 98.2°F | Resp 14 | Ht 69.0 in | Wt 212.0 lb

## 2019-10-05 MED ORDER — MUPIROCIN 2 % EX OINT
1.00 | TOPICAL_OINTMENT | Freq: Two times a day (BID) | CUTANEOUS | 0 refills | Status: DC
Start: 2019-10-05 — End: 2019-11-16

## 2019-10-05 NOTE — Progress Notes (Signed)
 [facility] Outpatient Note    Subjective: Lynn Tran is a 64 year old female presenting to clinic for Patient Complaint (Scrapped leg on shopping cart )    HPI   Patient presents for superficial laceration on right lower leg x 3 days ago. Scrapped her right leg on rusty shopping cart at [employer]. Cleaned the wound with soap and water. She has right leg ecchymosis and swelling around laceration. No pain, but mild tenderness to palpation. Denies erythema, pus, discharge. Requesting updated Tetanus. Last TDAP in 2015.     Review of Systems:   GENERAL: Denies fever, fatigue, chills, sweats, aches, loss of appetite, weight loss   HEENT: Denies hearing or vision changes headache  NECK: Denies Lymphadenopathy  CHEST: Denies Chest pain, palpitations  LUNGS: Denies cough, wheezing, shortness of breath  GI: Denies abdominal pain, nausea, vomitting, diarrhea, constipation, bloody stools, change in bowel or urinary habits,   SKIN: +right lower leg laceration Denies rash, changing skin lesions, itching, bleeding, discharge, pus   NEURO: Denies numbness or tingling, dizziness, light headedness     Current Outpatient Medications on File Prior to Visit   Medication Sig Dispense Refill   . albuterol  108 (90 Base) MCG/ACT inhaler Inhale 2 puffs by mouth every 6 hours as needed for Wheezing or Shortness of Breath. 8.5 g 11   . Biotin  1 MG CAPS      . budesonide -formoterol  (SYMBICORT ) 80-4.5 MCG/ACT inhaler inhale 2 puffs by mouth twice a day 30.6 g 3   . Cholecalciferol (VITAMIN D3) 1000 units tablet 4,000 Int'l Units.     . CINNAMON  PO      . clobetasol  propionate (TEMOVATE ) 0.05 % cream Apply 1 Application topically 2 times daily. Apply to affected area twice a day for recurrent contact dermatitis. 30 g 1   . Coenzyme Q10 (CO Q 10 PO)      . COLLAGEN PO      . diclofenac  (VOLTAREN ) 1 % gel Apply 4 g topically 4 times daily. Use the plastic dose guide found inside the box to measure the dose. 1 Tube 1   .  Glucosamine-MSM-Hyaluronic Acd (JOINT HEALTH PO)      . ibuprofen  (MOTRIN ) 800 MG tablet Take 1 tablet (800 mg) by mouth every 8 hours as needed for Mild Pain (Pain Score 1-3). Refilled require visit. 30 tablet 0   . KRILL OIL PO      . Milk Thistle 250 MG CAPS      . omeprazole  (PRILOSEC) 20 MG capsule take 1 capsule by mouth once daily 90 capsule 3     No current facility-administered medications on file prior to visit.      Immunization History   Administered Date(s) Administered   . (Shingles) Herpes Zoster Vaccine (ZOSTAVAX) 11/01/2016   . Influenza Vaccine (Unspecified) 02/26/2011, 02/26/2012, 03/12/2013   . Influenza Vaccine >=6 Months 03/29/2014, 03/18/2015, 03/06/2018   . Pneumococcal 23 Vaccine (PNEUMOVAX-23) 04/13/2014   . Tdap 04/13/2014     Allergies   Allergen Reactions   . Adhesive Tape Rash   . Dmdm Hydantoin Hives   . Codeine Nausea and Vomiting   . Morphine Nausea and Vomiting   . [Other] Other     Positive patch testing to:  Mercapto mix  quaternium 15  Imidazolidinyl urea  Neomycin   parthenolide  formaldehyde     Patient Active Problem List    Diagnosis Date Noted   . Skin lesion 03/16/2019     03/16/19 - per call  center "Pt  Called in regards to a discoloration on her hand and a lump underneath. Pt is requesting to be referred to a dermatologist. I Stated she should schedule a appointment with the provider to discuss and order out. Pt didn't want to schedule an appointment right now, please advise. " Referral placed. To be informed insurance may require visit.  04/12/19 - Benign likely, plan for biopsy.     . FH: melanoma 09/28/2018     09/25/2018 - Followed by Dermatology in past, now overdue for f/u. Referral placed.     Aaron Aas BRCA1 positive 09/28/2018     09/25/2018 - recent diagnosis after seeing genetic counselor. Plan to see GynOnc and Breast Clinic     . Encounter to establish care 09/28/2018     09/25/2018 - Records from Dr. Therisa Flatten, DO (previous PCP) requested. To have immunization  records input.     . Adverse effect of bisphosphonates 09/28/2018     09/25/2018 - patient reports took for 7-8 years but complicated by bone spurs in mouth and no longer taking. Needs DEXA when COVID-19 pandemic allows.     . Vitamin D  deficiency 09/28/2018     09/25/2018 - taking 4000 IU, to check level at July annual     . Elevated glucose 09/28/2018     09/25/2018 - noted on chart review; A1c in July     . History of cancer 07/16/2018     07/14/2018 - Referral to Genetics for "Multiple cancers: breast (twice), uterine, colon" [diagnosed with BRCA1)\]     . Acute urticaria 07/16/2018     07/14/2018 - Very likely from medication during recent colon cancer surgery (ancef vs opioid) 10 days ago. Discussed that could end up being chronic (6+weeks) and may need labs and Allergy referral; low suspicion this will occur. Discussion of prednisone  risks and together plan was formulated through shared decision making. The following discussed with patient and written into AVS: "- Daily zyrtec (ok to increase to twice daily).   - Ok for benadryl  25-50mg  at night for itching  - famotidine  20mg  twice daily  - Wait 1-2 days and if above measures aren't helping start the prednisone . Take 60mg  daily for 5-7 days, tapering is option  - We decided to hold on Allergy referral for now"   09/25/2018 - Resolved, discussed that as significant possibility hives from antibiotics eventual plan for Allergy referral.     . Chronic pain of left knee 07/16/2018     07/14/2018 - Asking for ibuprofen  800mg  for anticipated episodes severe pain. Not evaluated fully. #30 prescribed. Discussed risks, refills require more discussion (visit)     . History of rectal cancer 06/26/2018     07/14/2018 - Recent surgery for this 07/04/2018 with f/u 3 weeks from then with Dr. Sheryle Donning  09/25/2018 - Patient preferring to work with different surgeon, new referral placed     . GERD (gastroesophageal reflux disease) 09/18/2017   . Mild persistent asthma without  complication 09/18/2017     09/25/2018 - Lifelong diagnosis since age 24. Very stable on current medications. PF recommended today. Rare albuterol  use. To need pneumovax    Medications: Symbicort  80, albuterol  prn     . Contact dermatitis 09/18/2017     09/25/2018 - very extensive history here with multiple opinions and finally with patch testing with delayed result confirming DMDM hydantoin as causal agent. Has used clobetasol  foam for mild recurrences if accidentally exposed; prescription today for cream as cheaper likely.     Aaron Aas  Herpes simplex type 1 infection 09/18/2017     09/25/2018 - takes intermittent Valtrex  for cold sores.     . History of torn meniscus of left knee 09/18/2017   . History of breast cancer 09/18/2017     Diagnosed at 27,  Had a lumpectomy and radiaton.   Came back 11 years later, got R sided mastectomy with reconstruction and chemotherapy.     Aaron Aas History of uterine cancer 09/18/2017     High grade uterine cancer. 1998, total hysterectomy at that time and radiation   10/29/18 - Annual follow-up with Gynecology Oncology (next due June 2021). Needs to have pelvic examination yearly as she still has a small risk of developing a primary peritoneal cancer or a recurrence of her uterine cancer.        Past Medical History:   Diagnosis Date   . Asthma    . BRCA gene mutation positive in female    . Breast cancer (CMS-HCC)     right   . Ectopic pregnancy    . Molar pregnancy     tx with metotrexate at age 56 yo    . Rectal cancer (CMS-HCC)    . Screening for cervical cancer 09/28/2018    09/25/2018 - s/p hysterectomy for uterine cancer; defer to GynOnc re: if pap needed though unlikely. 10/29/18 - message to Tilton Fontan re: confirm no longer needing paps with h/o squamous metaplasia on cervical pathology 10/31/18 - no longer needs paps confirmed.   Aaron Aas Uterine cancer (CMS-HCC)      Past Surgical History:   Procedure Laterality Date   . h/o  S/p robotic assisted transanal excision of previous polypectomy  site within the distal rectum  05/2018   . BREAST SURGERY      Lumpectomy at 64 yo followed by masectomy and reconstruction at age 67 for a recurrence   . H/o ectopic pregnancy     . H/o Tah Bso      1998   . TONSILLECTOMY AND ADENOIDECTOMY       Social History     Socioeconomic History   . Marital status: Separated     Spouse name: Not on file   . Number of children: Not on file   . Years of education: Not on file   . Highest education level: Not on file   Occupational History   . Not on file   Social Needs   . Financial resource strain: Not on file   . Food insecurity     Worry: Not on file     Inability: Not on file   . Transportation needs     Medical: Not on file     Non-medical: Not on file   Tobacco Use   . Smoking status: Never Smoker   . Smokeless tobacco: Never Used   Substance and Sexual Activity   . Alcohol use: Yes     Frequency: 4 or more times a week     Drinks per session: 1 or 2   . Drug use: No   . Sexual activity: Not Currently   Lifestyle   . Physical activity     Days per week: Not on file     Minutes per session: Not on file   . Stress: Not on file   Relationships   . Social Wellsite geologist on phone: Not on file     Gets together: Not on file     Attends religious service:  Not on file     Active member of club or organization: Not on file     Attends meetings of clubs or organizations: Not on file     Relationship status: Not on file   . Intimate partner violence     Fear of current or ex partner: Not on file     Emotionally abused: Not on file     Physically abused: Not on file     Forced sexual activity: Not on file   Other Topics Concern   . Not on file   Social History Narrative   . Not on file       Objective:  Vitals:    10/05/19 1621   BP: 122/80   BP Location: Left arm   BP Patient Position: Sitting   BP cuff size: Large   Pulse: 88   Resp: 14   Temp: 98.2 F (36.8 C)   TempSrc: Temporal   SpO2: 99%   Weight: 96.2 kg (212 lb)   Height: 5\' 9"  (1.753 m)     Body mass index is 31.31  kg/m.    Wt Readings from Last 5 Encounters:   10/05/19 96.2 kg (212 lb)   10/27/18 91.1 kg (200 lb 14.4 oz)   09/18/17 87.5 kg (193 lb)     Blood Pressure   10/05/19 122/80   10/27/18 135/81   07/14/18 126/82   09/18/17 128/81       Physical Exam  Gen: in NAD, A&O, pleasant, non-toxic appearing  HEENT: NC/AT, No icterus, ptosis.   HEART: Regular rhythm and rate, No murmur, gallops, or rubs   LUNGS: clear to auscultation bilaterally, regular breathing rate and effort, No labored breathing. No adventitious breath sounds such as wheezing, rhonchi, or crackles.   Neurologic: Mentation appropriate. No facial droop.  Skin: +right lower leg superficial linear skin wound. Mild ecchymosis and TTP around wound. No bleeding, discharge, or pus.   EXTREMITIES: no clubbing, cyanosis or edema.  PERIPHERAL PULSES: normal (2+) bilaterally.    ASSESSMENT AND PLAN:  1. Laceration of right lower leg, initial encounter  64 year old female presents for superficial laceration on right lower leg x 3 days ago s/p  scraping her right leg on rusty shopping cart at [employer]  Aftercare discussed:   -Gently wash with antimicrobial soap and water. Do not let the wound sit in stagnant water.   -Perform daily dressing changes with topical antibiotics and new bandages.  -Continue to monitor daily for signs of infection, such as increased redness, warmth, swelling, pus, fevers, chills, or sweats.   Prescription for Mupirocin  prescribed:   - mupirocin  (BACTROBAN ) 2 % ointment; Apply 1 Application topically 2 times daily. Use a small amount as directed  Dispense: 1 Tube; Refill: 0  TD booster given in office     Follow up  F/u in the office if symptoms do not improve, worsen, or if patient has any concerns.     Annabell Baron, PA   [facility]

## 2019-10-05 NOTE — Interdisciplinary (Signed)
 Administered:TD  Verbal consent: yes  Dose: 0.5 mL   Site: left deltoid  Route: IM (Intramuscular)  Patient tolerated well: yes  A127A  09-11-2020  [id number]    No adverse reactions, monitored for 10 min

## 2019-10-05 NOTE — Addendum Note (Signed)
 Addended by: Naveen Clardy on: 10/05/2019 04:54 PM     Modules accepted: Orders, SmartSet

## 2019-10-05 NOTE — Telephone Encounter (Signed)
COVID-19 Assessment and Triage      Please note:  High risk category no longer required for testing.  Please continue to document which category they are in for risk stratification of results.    Use as part of cough, fever, URI or shortness of breath triage:  Do you have a fever OR a new cough or shortness of breath (SOB) no  or  Other symptoms seen in COVID 19 patients include NEW headache, chills with or without repeated shaking, chest tightness, anosmia (loss of smell), ageuisa (loss of taste), sore throat, diarrhea, severe muscle pain, and unexplained fatigue or syncope (fainting). no  Scratchy throat or a runny nose may also be symptoms, but when present in isolation would not prompt testing.        Does patient belong to one of the following categories (not required for testing)?  []High risk includes age>65, active smoker, chronic lung disease, chronic heart/liver/kidney disease, diabetes, immunosuppression, active cancer   []Any resident of or provider working in a senior living facility, including skilled nursing facilities or assisted living facilities.  []Health care worker or first responder or frontline worker like delivery or grocery  []Persons who care for the elderly.  []Persons experiencing homelessness.  []Any travel in the last 14 days?    []Are you waiting on results for a COVID-19 test?  []Have you been exposed to Corona Virus in the last 14 days or had contact to known COVID-19 cases?  []Close contact with anyone in above categories?   Pregnant?    No    If NO to symptoms then schedule as usual.    IF YES to symptoms call physician or clinic for additional instructions.

## 2019-10-22 ENCOUNTER — Encounter (INDEPENDENT_AMBULATORY_CARE_PROVIDER_SITE_OTHER): Payer: Self-pay | Admitting: Family

## 2019-10-22 ENCOUNTER — Ambulatory Visit (INDEPENDENT_AMBULATORY_CARE_PROVIDER_SITE_OTHER): Admitting: Family

## 2019-10-22 ENCOUNTER — Ambulatory Visit (INDEPENDENT_AMBULATORY_CARE_PROVIDER_SITE_OTHER): Admitting: General Practice

## 2019-10-22 VITALS — BP 128/91 | HR 84 | Temp 97.3°F | Resp 14 | Ht 69.0 in | Wt 208.6 lb

## 2019-10-23 NOTE — Progress Notes (Signed)
 SUBJECTIVE: Lynn Tran is a 64 year old female who presents to clinic for Patient Complaint (plantar warts left foot)      HPI  Lynn Tran presents to clinic today plantar wart removal.  Patient states that she has been treating using over the counter treatment, however no improvement to warts.    Review of Systems:   General - no fevers, chills, fatigue, unintentional weight loss, headache  Endocrine - no feeling unusually hot or cold (intolerance), hair loss  Oral - no bleeding from gums, open sores  Ear/Nose/Throat - no nose bleeds, sudden change in hearing, sore throat, runny  nose  Lungs - no cough, shortness of breath, hemoptysis, wheezing  GI - no bloody or black stools, abdominal pain, nausea, vomiting, diarrhea, change in bowel habits  Heart - no chest pain, pressure, irregular heartbeat/palpitations, leg swelling  Neurologic - no weakness, numbness or tingling, problems walking  MusculoSkeletal - no new joint pain, joint swelling, or warmth/stiffness  Skin - no rash, no changing skin lesions, itching  Psychiatric - no depression, behavior changes, significant anxiety or sleep disturbance  Kidney - no changes in urination, blood in urine, pain with urination, frequency, nocturia  Heme - no unusual easy bleeding or bruising    PAST MEDICAL AND SURGICAL HISTORY:    has a past surgical history that includes Breast surgery; Tonsillectomy and Adenoidectomy; H/o [place] B[place]; H/o ectopic pregnancy; and h/o  S/p robotic assisted transanal excision of previous polypectomy site within the distal rectum (05/2018).  Past Medical History:   Diagnosis Date   . Asthma    . BRCA gene mutation positive in female    . Breast cancer (CMS-HCC)     right   . Ectopic pregnancy    . Molar pregnancy     tx with metotrexate at age 62 yo    . Rectal cancer (CMS-HCC)    . Screening for cervical cancer 09/28/2018    09/25/2018 - s/p hysterectomy for uterine cancer; defer to GynOnc re: if pap needed though unlikely. 10/29/18 -  message to Tilton Fontan re: confirm no longer needing paps with h/o squamous metaplasia on cervical pathology 10/31/18 - no longer needs paps confirmed.   Aaron Aas Uterine cancer (CMS-HCC)        CURRENT MEDICATIONS:    Current Outpatient Medications:   .  albuterol  108 (90 Base) MCG/ACT inhaler, Inhale 2 puffs by mouth every 6 hours as needed for Wheezing or Shortness of Breath., Disp: 8.5 g, Rfl: 11  .  Biotin  1 MG CAPS, , Disp: , Rfl:   .  budesonide -formoterol  (SYMBICORT ) 80-4.5 MCG/ACT inhaler, inhale 2 puffs by mouth twice a day, Disp: 30.6 g, Rfl: 3  .  Cholecalciferol (VITAMIN D3) 1000 units tablet, 4,000 Int'l Units., Disp: , Rfl:   .  CINNAMON  PO, , Disp: , Rfl:   .  clobetasol  propionate (TEMOVATE ) 0.05 % cream, Apply 1 Application topically 2 times daily. Apply to affected area twice a day for recurrent contact dermatitis., Disp: 30 g, Rfl: 1  .  Coenzyme Q10 (CO Q 10 PO), , Disp: , Rfl:   .  COLLAGEN PO, , Disp: , Rfl:   .  diclofenac  (VOLTAREN ) 1 % gel, Apply 4 g topically 4 times daily. Use the plastic dose guide found inside the box to measure the dose., Disp: 1 Tube, Rfl: 1  .  Glucosamine-MSM-Hyaluronic Acd (JOINT HEALTH PO), , Disp: , Rfl:   .  ibuprofen  (MOTRIN ) 800 MG tablet, Take 1  tablet (800 mg) by mouth every 8 hours as needed for Mild Pain (Pain Score 1-3). Refilled require visit., Disp: 30 tablet, Rfl: 0  .  KRILL OIL PO, , Disp: , Rfl:   .  Milk Thistle 250 MG CAPS, , Disp: , Rfl:   .  mupirocin  (BACTROBAN ) 2 % ointment, Apply 1 Application topically 2 times daily. Use a small amount as directed, Disp: 1 Tube, Rfl: 0  .  omeprazole  (PRILOSEC) 20 MG capsule, take 1 capsule by mouth once daily, Disp: 90 capsule, Rfl: 3     ALLERGIES:  Adhesive tape, Dmdm hydantoin, Codeine, Morphine, and [other]    FAMILY HISTORY:  family history includes Breast Cancer in her paternal aunt and paternal grandfather; Breast Cancer (age of onset: 47) in her mother; Colon Cancer in her maternal uncle; Heart  Attack (age of onset: 32) in her father; Melanoma Cancer (age of onset: 54) in her mother; Other in her maternal uncle.    SOCIAL HISTORY:    reports that she has never smoked. She has never used smokeless tobacco. She reports current alcohol use. She reports that she does not use drugs.     OBJECTIVE:   BP (!) 128/91 (BP Location: Left arm, BP Patient Position: Sitting, BP cuff size: Regular)   Pulse 84   Temp 97.3 F (36.3 C) (Temporal)   Resp 14   Ht 5\' 9"  (1.753 m)   Wt 94.6 kg (208 lb 9.6 oz)   SpO2 98%   BMI 30.80 kg/m   Wt Readings from Last 5 Encounters:   10/22/19 94.6 kg (208 lb 9.6 oz)   10/05/19 96.2 kg (212 lb)   10/27/18 91.1 kg (200 lb 14.4 oz)   09/18/17 87.5 kg (193 lb)     Blood Pressure   10/22/19 (!) 128/91   10/05/19 122/80   10/27/18 135/81   07/14/18 126/82   09/18/17 128/81     The 10-year ASCVD risk score Risa Cheney DC Jr., et al., 2013) is: 4.9%    Values used to calculate the score:      Age: 19 years      Sex: Female      Is Non-Hispanic African American: No      Diabetic: No      Tobacco smoker: No      Systolic Blood Pressure: 128 mmHg      Is BP treated: No      HDL Cholesterol: 69 mg/dL      Total Cholesterol: 228 mg/dL    LABS:  Results for orders placed or performed in visit on 09/18/17   CBC w/ Diff Lavender   Result Value Ref Range    WBC 4.5 3.4 - 10.8 x10E3/uL    RBC 4.26 3.77 - 5.28 x10E6/uL    Hemoglobin 13.4 11.1 - 15.9 g/dL    Hematocrit 86.5 78.4 - 46.6 %    MCV 97 79 - 97 fL    MCH 31.5 26.6 - 33.0 pg    MCHC 32.5 31.5 - 35.7 g/dL    RDW 69.6 29.5 - 28.4 %    Platelets 324 150 - 450 x10E3/uL    Neutrophils 56 Not Estab. %    Lymphs 30 Not Estab. %    Monocytes 11 Not Estab. %    Eos 2 Not Estab. %    Basos 1 Not Estab. %    Neutrophils (Absolute) 2.5 1.4 - 7.0 x10E3/uL    Lymphs (Absolute) 1.4 0.7 - 3.1 x10E3/uL  Monocytes(Absolute) 0.5 0.1 - 0.9 x10E3/uL    Eos (Absolute) 0.1 0.0 - 0.4 x10E3/uL    Baso (Absolute) 0.0 0.0 - 0.2 x10E3/uL    Immature Granulocytes 0 Not  Estab. %    Immature Grans (Abs) 0.0 0.0 - 0.1 x10E3/uL   Comprehensive Metabolic Panel Green   Result Value Ref Range    Glucose 86 65 - 99 mg/dL    BUN 12 8 - 27 mg/dL    Creatinine 2.59 5.63 - 1.00 mg/dL    eGFR If NonAfricn Am 74 >59 mL/min/1.73    eGFR If Africn Am 85 >59 mL/min/1.73    BUN/Creatinine Ratio 14 12 - 28    Sodium 143 134 - 144 mmol/L    Potassium 4.8 3.5 - 5.2 mmol/L    Chloride 108 (H) 96 - 106 mmol/L    Carbon Dioxide 22 20 - 29 mmol/L    Calcium 9.5 8.7 - 10.3 mg/dL    Protein, Total, Serum 6.5 6.0 - 8.5 g/dL    Albumin 4.2 3.6 - 4.8 g/dL    Globulin, Total 2.3 1.5 - 4.5 g/dL    A/G Ratio 1.8 1.2 - 2.2    Bilirubin, Total 0.4 0.0 - 1.2 mg/dL    Alkaline Phos 67 39 - 117 IU/L    AST 22 0 - 40 IU/L    ALT (SGPT) 24 0 - 32 IU/L   Lipid Panel Green Plasma Separator Tube   Result Value Ref Range    Cholesterol 228 (H) 100 - 199 mg/dL    Triglycerides 79 0 - 149 mg/dL    HDL Cholesterol 69 >87 mg/dL    VLDL Cholesterol Cal 16 5 - 40 mg/dL    LDL Cholesterol Calc 143 (H) 0 - 99 mg/dL    Non-HDL Cholesterol 159 (H) 0 - 129 mg/dL   TSH, Blood - See Instructions   Result Value Ref Range    TSH 3.090 0.450 - 4.500 uIU/mL   Vitamin D , 25-Hydroxy, Blood Yellow serum separator tube   Result Value Ref Range    Vitamin D , 25-Hydroxy 55.5 30.0 - 100.0 ng/mL       IMAGING:  No results found.    PHYSICAL EXAMINATION:  Physical Exam  GENERAL:  well-appearing, well-developed, no acute distress.  Hygiene: Well-groomed. Alert oriented and pleasant   HEENT:  HEAD:  Normocephalic, atraumatic EYES:  PERRLA.  Sclera:  anicteric. EARS: canals normal. tympanic membranes normal bilaterally, no gross hearing deficits.  EOM:  intact.  NOSE: septum midline. Turbinates: pink ORAL CAVITY: normal.  Pharynx:  normal, no exudate, bilaterally.  NECK:  no lymphadenopathy.  No JVD.  Normal ROM.  Thyroid:  Unremarkable, no palpable thyroid abnormalities   LUNGS:  CTA bilaterally, no wheezing/rhonchi/rales. Breath sounds clear  bilaterally no respiratory distress.    HEART: normal S1S2, no S3 or S4. No murmurs, gallops, rubs.  PMI:  normal.  Rate:  normal.  Rhythm:  regular.    ABDOMEN:  Normoactive bowel sounds, no bruits, no distention, soft and non-tender.  Guarding:  absent.  Hernia:  none.  Inguinal nodes:  not enlarged.  Masses:  none.  Rebound tenderness:  absent.  Tenderness:  none.    EXTREMITIES:  No clubbing, edema, or cyanosis, no tremors, skin warm to touch and intact. Peripheral pulse +2 bilaterally    SKIN:  Color:  good.  General:  warm, moist.  Suspicious Lesions:  4 plantar warts on sole of left foot, 1 plantar wart sole of right  foot  NEUROLOGICAL:  Cerebellar function  WNL. CN's II-XII grossly intact. Normal gait.  normal strength bilaterally.  Reflexes:  2+ bilaterally and symmetric. normal sensation.    MUSCULOSKELETAL:  Cervical spines:  normal.  L-S spines:  normal.  Lower extremity joints:  normal.  Upper extremity joints:  normal.    PSYCHOLOGY:  Affect:  normal.  Mood:  pleasant. Orientation to person, place and time.        ASSESSMENT AND PLAN:  H&P reviewed, addressed issues and complaints.  Exam Completed.  Health maintenance tab updated, necessary outstanding orders placed.  Medications up to date, necessary refills ordered.     Plantar warts            There are no Patient Instructions on file for this visit.      FOLLOW UP  1.  Applied liquid nitrogen to all plantar warts present on both feet - patient tolerated well.  2.  Advised patient to use 40% salicylic acid and cover with duct tape to all warts, then file down using nail file.  3.  Patient understands treatment process and will return in 2 weeks for follow-up.  4.  Follow-up as needed.        Marti Slates, NP

## 2019-10-27 ENCOUNTER — Telehealth (INDEPENDENT_AMBULATORY_CARE_PROVIDER_SITE_OTHER): Payer: Self-pay

## 2019-10-27 NOTE — Telephone Encounter (Signed)
 Right foot lesion - recalls long-term having a purple area. Caused pain from the recent cryotherapy. 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 Able to get black thing out. Better now!    Patient appreciative of call.     [name], M.D.  [facility]

## 2019-10-27 NOTE — Telephone Encounter (Signed)
 [facility] Provider Message    Has there been a previous Telephone/MyChart encounter in the last month for same issue?: No    Caller informed that with few exceptions a visit is preferred and encouraged to schedule a visit: Yes    Who's calling?: Pt.    The call is regarding:     Pt calling, pt states that on 10-22-19 Dr. Marionette Sick assisted with burning off some plantar warts off of the bottom of pt's feet, pt thinks that on one of her feet it may not of been a plantar wart that he burnt off, pt would like to discuss with Dr.  Please call pt at the home number    Which provider is this relevant to?: Dr. Marionette Sick.  Is this provider in-office today and still seeing patients?: Yes    Ok with a response from a Engineer, site?: Yes    Caller informed that message should be addressed by a provider within 72 business hours?: Yes    If caller is requesting a more urgent response, ROUTE HIGH PRIORITY and inform patient that [facility] will make every effort to address within 24 business hours.    Please ask the caller how they would like to be contacted?: Phone Call. Best call back number: [phone]    Future Appointments   Date Time Provider Department Center   11/05/2019  1:20 PM Annabell Baron, [place] MPCI CC [facility]   11/16/2019  2:30 PM Canelo, Ninette Basque, [place] MPCI CC [facility]       [place] Lucianna Ostlund

## 2019-10-28 ENCOUNTER — Telehealth (INDEPENDENT_AMBULATORY_CARE_PROVIDER_SITE_OTHER): Payer: Self-pay | Admitting: Family Medicine

## 2019-10-28 NOTE — Telephone Encounter (Signed)
 Patient would like a call back to get scheduled for the lesion on her foot. Stated its not getting better.

## 2019-10-28 NOTE — Telephone Encounter (Signed)
 Lynn Tran, please assist in scheduling:  - 15 min visit, 11:15-11:30 (or one of the other slots if other patients cannot make)    [name], M.D.  [facility]

## 2019-10-28 NOTE — Telephone Encounter (Signed)
 Called patient, scheduled for in office visit tomorrow at 3:15-3:30pm, informed it will be a shorter visit.

## 2019-10-29 ENCOUNTER — Ambulatory Visit (INDEPENDENT_AMBULATORY_CARE_PROVIDER_SITE_OTHER): Admitting: Family Medicine

## 2019-10-29 NOTE — Progress Notes (Signed)
 [facility] Outpatient Note    Subjective: Lynn Tran is a 64 year old female who presents for Follow Up    HPI    The blue-purple lesion has been very painful. Feeling like a nail in there. Hobbling around from this. Taking care of 9 year old mother. This morning maybe a little better. Worse with walking. No fevers or chills. It was bleeding a little bit.  No swelling    Review of Systems: as per HPI    Outpatient Medications Prior to Visit   Medication Sig Dispense Refill   . albuterol  108 (90 Base) MCG/ACT inhaler Inhale 2 puffs by mouth every 6 hours as needed for Wheezing or Shortness of Breath. 8.5 g 11   . Biotin  1 MG CAPS      . budesonide -formoterol  (SYMBICORT ) 80-4.5 MCG/ACT inhaler inhale 2 puffs by mouth twice a day 30.6 g 3   . Cholecalciferol (VITAMIN D3) 1000 units tablet 4,000 Int'l Units.     . CINNAMON  PO      . clobetasol  propionate (TEMOVATE ) 0.05 % cream Apply 1 Application topically 2 times daily. Apply to affected area twice a day for recurrent contact dermatitis. 30 g 1   . Coenzyme Q10 (CO Q 10 PO)      . COLLAGEN PO      . diclofenac  (VOLTAREN ) 1 % gel Apply 4 g topically 4 times daily. Use the plastic dose guide found inside the box to measure the dose. 1 Tube 1   . Glucosamine-MSM-Hyaluronic Acd (JOINT HEALTH PO)      . ibuprofen  (MOTRIN ) 800 MG tablet Take 1 tablet (800 mg) by mouth every 8 hours as needed for Mild Pain (Pain Score 1-3). Refilled require visit. 30 tablet 0   . KRILL OIL PO      . Milk Thistle 250 MG CAPS      . mupirocin  (BACTROBAN ) 2 % ointment Apply 1 Application topically 2 times daily. Use a small amount as directed 1 Tube 0   . omeprazole  (PRILOSEC) 20 MG capsule take 1 capsule by mouth once daily 90 capsule 3     No facility-administered medications prior to visit.     Immunization History   Administered Date(s) Administered   . (Shingles) Herpes Zoster Vaccine (ZOSTAVAX) 11/01/2016   . Influenza Vaccine (Unspecified) 02/26/2011, 02/26/2012, 03/12/2013      . Influenza Vaccine >=6 Months 03/29/2014, 03/18/2015, 03/06/2018   . Pneumococcal 23 Vaccine (PNEUMOVAX-23) 04/13/2014   . Td 10/05/2019   . Tdap 04/13/2014     Allergies   Allergen Reactions   . Adhesive Tape Rash   . Dmdm Hydantoin Hives   . Codeine Nausea and Vomiting   . Morphine Nausea and Vomiting   . [Other] Other     Positive patch testing to:  Mercapto mix  quaternium 15  Imidazolidinyl urea  Neomycin   parthenolide  formaldehyde     Patient Active Problem List    Diagnosis Date Noted   . 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  10/27/2019 - Right foot lesion - recalls long-term having a purple area. Caused pain from the recent cryotherapy. 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 Able to get black thing out. Better now!  10/29/2019 - Blood blister on exam, there is significant pain (better this morning, worse after walking). On exam there is a deep vesicular component with minimal tenderness and no evidence of infection. Suspect will resolve with conservative  measures. The following was communicated verbally and in writing: "- Arnica gel 3-4 times a day PLUS Arnica oral (boiron) 5 pellets three times a day  - 2-3 times a day warm soaks (Epsom salts, optional) followed by gentle massage  - Either a heel lift or an entire insole with a big hole cut out"      . Skin lesion 03/16/2019     03/16/19 - per call center "Pt  Called in regards to a discoloration on her hand and a lump underneath. Pt is requesting to be referred to a dermatologist. I Stated she should schedule a appointment with the provider to discuss and order out. Pt didn't want to schedule an appointment right now, please advise. " Referral placed. To be informed insurance may require visit.  04/12/19 - Benign likely, plan for biopsy.     . FH: melanoma 09/28/2018     09/25/2018 - Followed by Dermatology in past, now overdue for f/u. Referral placed.     Aaron Aas BRCA1 positive 09/28/2018      09/25/2018 - recent diagnosis after seeing genetic counselor. Plan to see GynOnc and Breast Clinic     . Encounter to establish care 09/28/2018     09/25/2018 - Records from Dr. Therisa Flatten, DO (previous PCP) requested. To have immunization records input.     . Adverse effect of bisphosphonates 09/28/2018     09/25/2018 - patient reports took for 7-8 years but complicated by bone spurs in mouth and no longer taking. Needs DEXA when COVID-19 pandemic allows.     . Vitamin D  deficiency 09/28/2018     09/25/2018 - taking 4000 IU, to check level at July annual     . Elevated glucose 09/28/2018     09/25/2018 - noted on chart review; A1c in July     . History of cancer 07/16/2018     07/14/2018 - Referral to Genetics for "Multiple cancers: breast (twice), uterine, colon" [diagnosed with BRCA1)\]     . Acute urticaria 07/16/2018     07/14/2018 - Very likely from medication during recent colon cancer surgery (ancef vs opioid) 10 days ago. Discussed that could end up being chronic (6+weeks) and may need labs and Allergy referral; low suspicion this will occur. Discussion of prednisone  risks and together plan was formulated through shared decision making. The following discussed with patient and written into AVS: "- Daily zyrtec (ok to increase to twice daily).   - Ok for benadryl  25-50mg  at night for itching  - famotidine  20mg  twice daily  - Wait 1-2 days and if above measures aren't helping start the prednisone . Take 60mg  daily for 5-7 days, tapering is option  - We decided to hold on Allergy referral for now"   09/25/2018 - Resolved, discussed that as significant possibility hives from antibiotics eventual plan for Allergy referral.     . Chronic pain of left knee 07/16/2018     07/14/2018 - Asking for ibuprofen  800mg  for anticipated episodes severe pain. Not evaluated fully. #30 prescribed. Discussed risks, refills require more discussion (visit)     . History of rectal cancer 06/26/2018     07/14/2018 - Recent surgery for this  07/04/2018 with f/u 3 weeks from then with Dr. Sheryle Donning  09/25/2018 - Patient preferring to work with different surgeon, new referral placed     . GERD (gastroesophageal reflux disease) 09/18/2017   . Mild persistent asthma without complication 09/18/2017     09/25/2018 - Lifelong diagnosis since age 49.  Very stable on current medications. PF recommended today. Rare albuterol  use. To need pneumovax    Medications: Symbicort  80, albuterol  prn     . Contact dermatitis 09/18/2017     09/25/2018 - very extensive history here with multiple opinions and finally with patch testing with delayed result confirming DMDM hydantoin as causal agent. Has used clobetasol  foam for mild recurrences if accidentally exposed; prescription today for cream as cheaper likely.     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     . History of torn meniscus of left knee 09/18/2017   . History of breast cancer 09/18/2017     Diagnosed at 27,  Had a lumpectomy and radiaton.   Came back 11 years later, got R sided mastectomy with reconstruction and chemotherapy.     Aaron Aas History of uterine cancer 09/18/2017     High grade uterine cancer. 1998, total hysterectomy at that time and radiation   10/29/18 - Annual follow-up with Gynecology Oncology (next due June 2021). Needs to have pelvic examination yearly as she still has a small risk of developing a primary peritoneal cancer or a recurrence of her uterine cancer.        Past Medical History:   Diagnosis Date   . Asthma    . BRCA gene mutation positive in female    . Breast cancer (CMS-HCC)     right   . Ectopic pregnancy    . Molar pregnancy     tx with metotrexate at age 37 yo    . Rectal cancer (CMS-HCC)    . Screening for cervical cancer 09/28/2018    09/25/2018 - s/p hysterectomy for uterine cancer; defer to GynOnc re: if pap needed though unlikely. 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   Aaron Aas Uterine cancer (CMS-HCC)      Past Surgical History:   Procedure Laterality Date   . h/o  S/p robotic assisted transanal excision of previous polypectomy site within the distal rectum  05/2018   . BREAST SURGERY      Lumpectomy at 64 yo followed by masectomy and reconstruction at age 6 for a recurrence   . H/o ectopic pregnancy     . H/o [place]      1998   . TONSILLECTOMY AND ADENOIDECTOMY       Social History     Socioeconomic History   . Marital status: Separated     Spouse name: Not on file   . Number of children: Not on file   . Years of education: Not on file   . Highest education level: Not on file   Occupational History   . Not on file   Tobacco Use   . Smoking status: Never Smoker   . Smokeless tobacco: Never Used   Substance and Sexual Activity   . Alcohol use: Yes   . Drug use: No   . Sexual activity: Not Currently   Other Topics Concern   . Not on file   Social History Narrative   . Not on file     Social Determinants of Health     Financial Resource Strain:    . Difficulty of Paying Living Expenses:    Food Insecurity:    . Worried About Programme researcher, broadcasting/film/video in the Last Year:    .  Ran Out of Food in the Last Year:    Transportation Needs:    . Freight forwarder (Medical):    Aaron Aas Lack of Transportation (Non-Medical):    Physical Activity:    . Days of Exercise per Week:    . Minutes of Exercise per Session:    Stress:    . Feeling of Stress :    Social Connections:    . Frequency of Communication with Friends and Family:    . Frequency of Social Gatherings with Friends and Family:    . Attends Religious Services:    . Active Member of Clubs or Organizations:    . Attends Banker Meetings:    Aaron Aas Marital Status:    Intimate Partner Violence:    . Fear of Current or Ex-Partner:    . Emotionally Abused:    Aaron Aas Physically Abused:    . Sexually Abused:      Family History   Problem Relation Name Age of Onset   . Breast  Cancer Mother  83        Recurrence 41   . Melanoma Cancer Mother  24   . Heart Attack Father  16   . Breast Cancer Paternal Grandfather          Diagnosed in 17s   . Colon Cancer Maternal Uncle          35s   . Other Maternal Uncle          Cushing's   . Breast Cancer Paternal Aunt          26s     Family Status   Relation Status   . Mo (Not Specified)   . Fa (Not Specified)   . PGFa (Not Specified)   . MUnc (Not Specified)   . MUnc (Not Specified)   . PAunt (Not Specified)     Objective:  Vitals:    10/29/19 1508   BP: 129/83   BP Location: Left arm   BP Patient Position: Sitting   BP cuff size: Regular   Pulse: 94   Resp: 14   Temp: 97.2 F (36.2 C)   TempSrc: Temporal   SpO2: 97%   Weight: 94.3 kg (208 lb)   Height: 5\' 9"  (1.753 m)     Body mass index is 30.72 kg/m.    Wt Readings from Last 10 Encounters:   10/29/19 94.3 kg (208 lb)   10/22/19 94.6 kg (208 lb 9.6 oz)   10/05/19 96.2 kg (212 lb)   10/27/18 91.1 kg (200 lb 14.4 oz)   09/18/17 87.5 kg (193 lb)     Blood Pressure   10/29/19 129/83   10/22/19 (!) 128/91   10/05/19 122/80   10/27/18 135/81   07/14/18 126/82   09/18/17 128/81     Physical Exam  Gen: in NAD, A&O, pleasant, non-toxic appearing  HEENT: NC/AT, No icterus, ptosis.   Lungs: Breathing comfortably on RA  Neurologic: Mentation appropriate. No facial droop.  Ext: There is a 2mm dark lesion on the heel on the right foot. There is a 1 cm cyst beneath the surface with mild tenderness and purple discoloration.      Assessment & Plan:  Traumatic bulla  Overview:  10/22/19 - Seen for cryotherapy including purple lesion felt to be verrucous.  10/27/2019 - Right foot lesion - recalls long-term having a purple area. Caused pain from the recent cryotherapy. 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 Able to get black thing  out. Better now!  10/29/2019 - Blood blister on exam, there is significant pain (better this morning, worse after walking). On exam there is a deep vesicular component with minimal tenderness and  no evidence of infection. Suspect will resolve with conservative measures. The following was communicated verbally and in writing: "- Arnica gel 3-4 times a day PLUS Arnica oral (boiron) 5 pellets three times a day  - 2-3 times a day warm soaks (Epsom salts, optional) followed by gentle massage  - Either a heel lift or an entire insole with a big hole cut out"         Billing Statement:     On the day of the visit, I spent:  - 15 minutes conducting the visit including time communicating with the patient, communicating the follow-up plan, recommending a treatment plan and it's risks, benefits, and alternatives and explaining diagnosis  - 5 minutes after the visit working on documentation of clinical information in the electronic health record  For a total time of 20 minutes on the care of this patient.     Below Patient Instructions were all discussed with the patient.  Patient Instructions   Here are your instructions from our visit. I   - Arnica gel 3-4 times a day PLUS Arnica oral (boiron) 5 pellets three times a day  - 2-3 times a day warm soaks (Epsom salts, optional) followed by gentle massage  - Either a heel lift or an entire insole with a big hole cut out  Best,  Dr. Marionette Sick        Health Maintenance   Topic Date Due   . COVID-19 Vaccine (1) Never done   . Shingles Vaccine (3 of 3) 08/01/2017   . PHQ2 depression screen  09/19/2018   . Breast Cancer Screen  04/15/2019   . Influenza (Season Ended) 12/27/2019   . Pneumococcal Vaccine (2 of 2 - PPSV23) 10/13/2020   . Colonoscopy  07/15/2024   . Tetanus (3 - Td or Tdap) 10/04/2029   . Hepatitis C Screening  Addressed   . Polio Vaccine  Aged Out   . HPV Vaccine <= 26 Yrs  Aged Out   . Meningococcal MCV4 Vaccine  Aged Out   . Cervical Cancer Screening  Discontinued     Follow up  Return if symptoms worsen or fail to improve.    Future Appointments   Date Time Provider Department Center   10/29/2019  3:15 PM Lender Quarto, [place] [facility]   11/05/2019  1:20  PM Annabell Baron, [place] MPCI CC [facility]   11/16/2019  2:30 PM Lavina Resor, Ninette Basque, MD MPCI CC [facility]       [name], M.D.  [facility]

## 2019-10-29 NOTE — Patient Instructions (Addendum)
 Here are your instructions from our visit. I   - Arnica gel 3-4 times a day PLUS Arnica oral (boiron) 5 pellets three times a day  - 2-3 times a day warm soaks (Epsom salts, optional) followed by gentle massage  - Either a heel lift or an entire insole with a big hole cut out  Best,  Dr. Marionette Sick

## 2019-11-05 ENCOUNTER — Ambulatory Visit (INDEPENDENT_AMBULATORY_CARE_PROVIDER_SITE_OTHER): Admitting: Physician Assistant

## 2019-11-05 VITALS — BP 130/91 | HR 91 | Temp 97.2°F | Resp 16 | Ht 69.0 in | Wt 206.6 lb

## 2019-11-05 NOTE — Progress Notes (Signed)
 [facility] Outpatient Note    Subjective: Bonetta Mostek is a 64 year old female presenting to clinic for Follow Up    HPI  Patient presents for cryotherapy for left foot plantar warts. Cryotherapy performed on 5/27. Noted to have 4 plantar warts on left foot and 1 on her right foot. Using salicylic acid 40% pads as well. Tolerated cryotherapy well. Denies side effects.     Review of Systems: As per HPI     Current Outpatient Medications on File Prior to Visit   Medication Sig Dispense Refill   . albuterol  108 (90 Base) MCG/ACT inhaler Inhale 2 puffs by mouth every 6 hours as needed for Wheezing or Shortness of Breath. 8.5 g 11   . Biotin  1 MG CAPS      . budesonide -formoterol  (SYMBICORT ) 80-4.5 MCG/ACT inhaler inhale 2 puffs by mouth twice a day 30.6 g 3   . Cholecalciferol (VITAMIN D3) 1000 units tablet 4,000 Int'l Units.     . CINNAMON  PO      . clobetasol  propionate (TEMOVATE ) 0.05 % cream Apply 1 Application topically 2 times daily. Apply to affected area twice a day for recurrent contact dermatitis. 30 g 1   . Coenzyme Q10 (CO Q 10 PO)      . COLLAGEN PO      . diclofenac  (VOLTAREN ) 1 % gel Apply 4 g topically 4 times daily. Use the plastic dose guide found inside the box to measure the dose. 1 Tube 1   . Glucosamine-MSM-Hyaluronic Acd (JOINT HEALTH PO)      . ibuprofen  (MOTRIN ) 800 MG tablet Take 1 tablet (800 mg) by mouth every 8 hours as needed for Mild Pain (Pain Score 1-3). Refilled require visit. 30 tablet 0   . KRILL OIL PO      . Milk Thistle 250 MG CAPS      . mupirocin  (BACTROBAN ) 2 % ointment Apply 1 Application topically 2 times daily. Use a small amount as directed 1 Tube 0   . omeprazole  (PRILOSEC) 20 MG capsule take 1 capsule by mouth once daily 90 capsule 3     No current facility-administered medications on file prior to visit.     Immunization History   Administered Date(s) Administered   . (Shingles) Herpes Zoster Vaccine (ZOSTAVAX) 11/01/2016   . Influenza Vaccine (Unspecified)  02/26/2011, 02/26/2012, 03/12/2013   . Influenza Vaccine >=6 Months 03/29/2014, 03/18/2015, 03/06/2018   . Pneumococcal 23 Vaccine (PNEUMOVAX-23) 04/13/2014   . Td 10/05/2019   . Tdap 04/13/2014     Allergies   Allergen Reactions   . Adhesive Tape Rash   . Dmdm Hydantoin Hives   . Codeine Nausea and Vomiting   . Morphine Nausea and Vomiting   . [Other] Other     Positive patch testing to:  Mercapto mix  quaternium 15  Imidazolidinyl urea  Neomycin   parthenolide  formaldehyde     Patient Active Problem List    Diagnosis Date Noted   . 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  10/27/2019 - Right foot lesion - recalls long-term having a purple area. Caused pain from the recent cryotherapy. 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 Able to get black thing out. Better now!  10/29/2019 - Blood blister on exam, there is significant pain (better this morning, worse after walking). On exam there is a deep vesicular component with minimal tenderness and no evidence of infection. Suspect will resolve with  conservative measures. The following was communicated verbally and in writing: "- Arnica gel 3-4 times a day PLUS Arnica oral (boiron) 5 pellets three times a day  - 2-3 times a day warm soaks (Epsom salts, optional) followed by gentle massage  - Either a heel lift or an entire insole with a big hole cut out"      . Skin lesion 03/16/2019     03/16/19 - per call center "Pt  Called in regards to a discoloration on her hand and a lump underneath. Pt is requesting to be referred to a dermatologist. I Stated she should schedule a appointment with the provider to discuss and order out. Pt didn't want to schedule an appointment right now, please advise. " Referral placed. To be informed insurance may require visit.  04/12/19 - Benign likely, plan for biopsy.     . FH: melanoma 09/28/2018     09/25/2018 - Followed by Dermatology in past, now overdue for f/u. Referral placed.        Aaron Aas BRCA1 positive 09/28/2018     09/25/2018 - recent diagnosis after seeing genetic counselor. Plan to see GynOnc and Breast Clinic     . Encounter to establish care 09/28/2018     09/25/2018 - Records from Dr. Therisa Flatten, DO (previous PCP) requested. To have immunization records input.     . Adverse effect of bisphosphonates 09/28/2018     09/25/2018 - patient reports took for 7-8 years but complicated by bone spurs in mouth and no longer taking. Needs DEXA when COVID-19 pandemic allows.     . Vitamin D  deficiency 09/28/2018     09/25/2018 - taking 4000 IU, to check level at July annual     . Elevated glucose 09/28/2018     09/25/2018 - noted on chart review; A1c in July     . History of cancer 07/16/2018     07/14/2018 - Referral to Genetics for "Multiple cancers: breast (twice), uterine, colon" [diagnosed with BRCA1)\]     . Acute urticaria 07/16/2018     07/14/2018 - Very likely from medication during recent colon cancer surgery (ancef vs opioid) 10 days ago. Discussed that could end up being chronic (6+weeks) and may need labs and Allergy referral; low suspicion this will occur. Discussion of prednisone  risks and together plan was formulated through shared decision making. The following discussed with patient and written into AVS: "- Daily zyrtec (ok to increase to twice daily).   - Ok for benadryl  25-50mg  at night for itching  - famotidine  20mg  twice daily  - Wait 1-2 days and if above measures aren't helping start the prednisone . Take 60mg  daily for 5-7 days, tapering is option  - We decided to hold on Allergy referral for now"   09/25/2018 - Resolved, discussed that as significant possibility hives from antibiotics eventual plan for Allergy referral.     . Chronic pain of left knee 07/16/2018     07/14/2018 - Asking for ibuprofen  800mg  for anticipated episodes severe pain. Not evaluated fully. #30 prescribed. Discussed risks, refills require more discussion (visit)     . History of rectal cancer 06/26/2018      07/14/2018 - Recent surgery for this 07/04/2018 with f/u 3 weeks from then with Dr. Sheryle Donning  09/25/2018 - Patient preferring to work with different surgeon, new referral placed     . GERD (gastroesophageal reflux disease) 09/18/2017   . Mild persistent asthma without complication 09/18/2017     09/25/2018 - Lifelong diagnosis  since age 56. Very stable on current medications. PF recommended today. Rare albuterol  use. To need pneumovax    Medications: Symbicort  80, albuterol  prn     . Contact dermatitis 09/18/2017     09/25/2018 - very extensive history here with multiple opinions and finally with patch testing with delayed result confirming DMDM hydantoin as causal agent. Has used clobetasol  foam for mild recurrences if accidentally exposed; prescription today for cream as cheaper likely.     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     . History of torn meniscus of left knee 09/18/2017   . History of breast cancer 09/18/2017     Diagnosed at 27,  Had a lumpectomy and radiaton.   Came back 11 years later, got R sided mastectomy with reconstruction and chemotherapy.     Aaron Aas History of uterine cancer 09/18/2017     High grade uterine cancer. 1998, total hysterectomy at that time and radiation   10/29/18 - Annual follow-up with Gynecology Oncology (next due June 2021). Needs to have pelvic examination yearly as she still has a small risk of developing a primary peritoneal cancer or a recurrence of her uterine cancer.        Past Medical History:   Diagnosis Date   . Asthma    . BRCA gene mutation positive in female    . Breast cancer (CMS-HCC)     right   . Ectopic pregnancy    . Molar pregnancy     tx with metotrexate at age 63 yo    . Rectal cancer (CMS-HCC)    . Screening for cervical cancer 09/28/2018    09/25/2018 - s/p hysterectomy for uterine cancer; defer to GynOnc re: if pap needed though unlikely. 10/29/18 - message to Tilton Fontan re: confirm no  longer needing paps with h/o squamous metaplasia on cervical pathology 10/31/18 - no longer needs paps confirmed.   Aaron Aas Uterine cancer (CMS-HCC)      Past Surgical History:   Procedure Laterality Date   . h/o  S/p robotic assisted transanal excision of previous polypectomy site within the distal rectum  05/2018   . BREAST SURGERY      Lumpectomy at 64 yo followed by masectomy and reconstruction at age 49 for a recurrence   . H/o ectopic pregnancy     . H/o [place]      1998   . TONSILLECTOMY AND ADENOIDECTOMY       Social History     Socioeconomic History   . Marital status: Separated     Spouse name: Not on file   . Number of children: Not on file   . Years of education: Not on file   . Highest education level: Not on file   Occupational History   . Not on file   Tobacco Use   . Smoking status: Never Smoker   . Smokeless tobacco: Never Used   Substance and Sexual Activity   . Alcohol use: Yes   . Drug use: No   . Sexual activity: Not Currently   Other Topics Concern   . Not on file   Social History Narrative   . Not on file     Social Determinants of Health     Financial Resource Strain:    . Difficulty of Paying Living Expenses:    Food Insecurity:    . Worried About Programme researcher, broadcasting/film/video in the  Last Year:    . Ran Out of Food in the Last Year:    Transportation Needs:    . Freight forwarder (Medical):    Aaron Aas Lack of Transportation (Non-Medical):    Physical Activity:    . Days of Exercise per Week:    . Minutes of Exercise per Session:    Stress:    . Feeling of Stress :    Social Connections:    . Frequency of Communication with Friends and Family:    . Frequency of Social Gatherings with Friends and Family:    . Attends Religious Services:    . Active Member of Clubs or Organizations:    . Attends Banker Meetings:    Aaron Aas Marital Status:    Intimate Partner Violence:    . Fear of Current or Ex-Partner:    . Emotionally Abused:    Aaron Aas Physically Abused:    . Sexually Abused:        Objective:  Vitals:     11/05/19 1322   BP: (!) 130/91   BP Location: Left arm   BP Patient Position: Sitting   BP cuff size: Regular   Pulse: 91   Resp: 16   Temp: 97.2 F (36.2 C)   TempSrc: Temporal   SpO2: 98%   Weight: 93.7 kg (206 lb 9.6 oz)   Height: 5\' 9"  (1.753 m)     Body mass index is 30.51 kg/m.    Wt Readings from Last 5 Encounters:   11/05/19 93.7 kg (206 lb 9.6 oz)   10/29/19 94.3 kg (208 lb)   10/22/19 94.6 kg (208 lb 9.6 oz)   10/05/19 96.2 kg (212 lb)   10/27/18 91.1 kg (200 lb 14.4 oz)     Blood Pressure   11/05/19 (!) 130/91   10/29/19 129/83   10/22/19 (!) 128/91   10/05/19 122/80   10/27/18 135/81       Physical Exam  Gen: in NAD, A&O, pleasant, non-toxic appearing  HEENT: NC/AT, No icterus, ptosis.   Lungs: Breathing comfortably on RA  Neurologic: Mentation appropriate. No facial droop.  Skin: No rashes or lesions on observed areas.   Lesions:  4 plantar warts on sole of left foot    Procedure: Cryotherapy of left foot plantar warts.  Side effects discussed: The acute response to cryotherapy ranges from minimal erythema to hemorrhagic blistering, pain, and tenderness. Healing usually occurs within four to seven days. Local hypopigmentation can occur, and patients with dark skin should be treated with caution. Occasionally, blistering can result in spread of the virus to adjacent skin, resulting in a larger wart.   Patient understands risks and agrees to treatment.   Two cycles of cryotherapy with 10 second freeze-thaw seconds applied to 4 plantar warts on the plantar aspect of the patient's left foot.   Patient tolerated procedure well.     ASSESSMENT AND PLAN:  1. Plantar warts  Cryotherapy performed in the office on 4 left foot plantar warts.  Patient tolerated well  Continue salicylic acid 40% pads in combination with cryotherapy     Follow up  Annual 6/21    Annabell Baron, PA   [facility]

## 2019-11-16 ENCOUNTER — Encounter (INDEPENDENT_AMBULATORY_CARE_PROVIDER_SITE_OTHER): Payer: Self-pay | Admitting: Family Medicine

## 2019-11-16 ENCOUNTER — Ambulatory Visit (INDEPENDENT_AMBULATORY_CARE_PROVIDER_SITE_OTHER): Admitting: Family Medicine

## 2019-11-16 VITALS — BP 138/89 | HR 93 | Temp 97.3°F | Resp 17 | Ht 69.0 in | Wt 202.8 lb

## 2019-11-16 MED ORDER — OMEPRAZOLE 20 MG OR CPDR
20.0000 mg | DELAYED_RELEASE_CAPSULE | Freq: Every day | ORAL | 3 refills | Status: AC
Start: 2019-11-16 — End: ?

## 2019-11-16 MED ORDER — ALBUTEROL SULFATE 108 (90 BASE) MCG/ACT IN AERS
2.0000 | INHALATION_SPRAY | Freq: Four times a day (QID) | RESPIRATORY_TRACT | 11 refills | Status: AC | PRN
Start: 2019-11-16 — End: ?

## 2019-11-16 MED ORDER — VALACYCLOVIR HCL 1000 MG OR TABS
2000.0000 mg | ORAL_TABLET | Freq: Two times a day (BID) | ORAL | 1 refills | Status: AC
Start: 2019-11-16 — End: 2019-11-17

## 2019-11-16 MED ORDER — CLOBETASOL PROPIONATE 0.05 % EX CREA
1.0000 | TOPICAL_CREAM | Freq: Two times a day (BID) | CUTANEOUS | 1 refills | Status: AC
Start: 2019-11-16 — End: ?

## 2019-11-17 ENCOUNTER — Telehealth (INDEPENDENT_AMBULATORY_CARE_PROVIDER_SITE_OTHER): Payer: Self-pay | Admitting: Family Medicine

## 2019-11-18 ENCOUNTER — Telehealth (INDEPENDENT_AMBULATORY_CARE_PROVIDER_SITE_OTHER): Payer: Self-pay | Admitting: Family Medicine

## 2019-11-19 ENCOUNTER — Ambulatory Visit (INDEPENDENT_AMBULATORY_CARE_PROVIDER_SITE_OTHER)

## 2019-11-20 ENCOUNTER — Telehealth (INDEPENDENT_AMBULATORY_CARE_PROVIDER_SITE_OTHER): Payer: Self-pay | Admitting: Family Medicine

## 2019-11-20 LAB — COMPREHENSIVE METABOLIC PANEL, BLOOD
A/G Ratio: 2.1 (ref 1.2–2.2)
ALT (SGPT): 27 IU/L (ref 0–32)
AST: 28 IU/L (ref 0–40)
Albumin: 4.4 g/dL (ref 3.8–4.8)
Alkaline Phos: 73 IU/L (ref 48–121)
BUN/Creatinine Ratio: 18 (ref 12–28)
BUN: 16 mg/dL (ref 8–27)
Bilirubin, Total: 0.5 mg/dL (ref 0.0–1.2)
Calcium: 9.5 mg/dL (ref 8.7–10.3)
Carbon Dioxide: 21 mmol/L (ref 20–29)
Chloride: 108 mmol/L — ABNORMAL HIGH (ref 96–106)
Creatinine: 0.88 mg/dL (ref 0.57–1.00)
Globulin, Total: 2.1 g/dL (ref 1.5–4.5)
Glucose: 88 mg/dL (ref 65–99)
Potassium: 4.7 mmol/L (ref 3.5–5.2)
Protein, Total, Serum: 6.5 g/dL (ref 6.0–8.5)
Sodium: 144 mmol/L (ref 134–144)
eGFR If Africn Am: 80 mL/min/{1.73_m2} (ref >59)
eGFR If NonAfricn Am: 70 mL/min/{1.73_m2} (ref >59)

## 2019-11-20 LAB — URINALYSIS
Bilirubin: NEGATIVE
Blood: NEGATIVE
Glucose: NEGATIVE
Ketones: NEGATIVE
Leuk Esterase: NEGATIVE
Nitrite: NEGATIVE
Protein: NEGATIVE
Specific Gravity: 1.017 (ref 1.005–1.030)
Urobilinogen,Semi-Qn: 0.2 mg/dL (ref 0.2–1.0)
pH: 5.5 (ref 5.0–7.5)

## 2019-11-20 LAB — GAMMA GLUTAMYL TRANSFERASE, BLOOD: GGT: 21 IU/L (ref 0–60)

## 2019-11-20 LAB — CBC WITH DIFF, BLOOD
Baso (Absolute): 0.1 10*3/uL (ref 0.0–0.2)
Basos: 1 %
Eos (Absolute): 0.1 10*3/uL (ref 0.0–0.4)
Eos: 3 %
Hematocrit: 39.3 % (ref 34.0–46.6)
Hemoglobin: 13.5 g/dL (ref 11.1–15.9)
Immature Grans (Abs): 0 10*3/uL (ref 0.0–0.1)
Immature Granulocytes: 0 %
Lymphs (Absolute): 1.6 10*3/uL (ref 0.7–3.1)
Lymphs: 40 %
MCH: 32.5 pg (ref 26.6–33.0)
MCHC: 34.4 g/dL (ref 31.5–35.7)
MCV: 95 fL (ref 79–97)
Monocytes(Absolute): 0.5 10*3/uL (ref 0.1–0.9)
Monocytes: 12 %
Neutrophils (Absolute): 1.8 10*3/uL (ref 1.4–7.0)
Neutrophils: 44 %
Platelets: 298 10*3/uL (ref 150–450)
RBC: 4.16 x10E6/uL (ref 3.77–5.28)
RDW: 13.1 % (ref 11.7–15.4)
WBC: 4 10*3/uL (ref 3.4–10.8)

## 2019-11-20 LAB — VITAMIN B12, BLOOD: Vitamin B12: 1934 pg/mL — ABNORMAL HIGH (ref 232–1245)

## 2019-11-20 LAB — LIPID(CHOL FRACT) PANEL, BLOOD
Cholesterol: 212 mg/dL — ABNORMAL HIGH (ref 100–199)
HDL Cholesterol: 64 mg/dL (ref >39)
LDL Chol CAL (NIH) - LABCORP: 136 mg/dL — ABNORMAL HIGH (ref 0–99)
Non-HDL Cholesterol: 148 mg/dL — ABNORMAL HIGH (ref 0–129)
Triglycerides: 65 mg/dL (ref 0–149)
VLDL Cholesterol CAL: 12 mg/dL (ref 5–40)

## 2019-11-20 LAB — TSH, BLOOD: TSH: 2.86 u[IU]/mL (ref 0.450–4.500)

## 2019-11-20 LAB — VITAMIN D, 25-OH TOTAL: Vitamin D, 25-Hydroxy: 42.1 ng/mL (ref 30.0–100.0)

## 2019-11-20 LAB — CEA TUMOR, BLOOD: CEA: 1.8 ng/mL (ref 0.0–4.7)

## 2019-11-20 LAB — IRON/IBC PANEL
Iron Binding Capacity (TIBC): 316 ug/dL (ref 250–450)
Iron Saturation: 37 % (ref 15–55)
Iron, Serum: 118 ug/dL (ref 27–139)
UIBC: 198 ug/dL (ref 118–369)

## 2019-11-20 LAB — GLYCOSYLATED HGB(A1C), BLOOD: Hemoglobin A1c: 5.3 % (ref 4.8–5.6)

## 2019-11-20 LAB — URINALYSIS MICROSCOPIC ONLY, RANDOM URINE
Bacteria: NONE SEEN
Casts: NONE SEEN /lpf
Epithelial Cells (non renal): NONE SEEN /hpf (ref 0–10)
RBC: NONE SEEN /hpf (ref 0–2)

## 2019-11-20 LAB — INTERPRETATION - LABCORP

## 2019-11-20 LAB — HIV 1/2 ANTIBODY & P24 ANTIGEN ASSAY, BLOOD: HIV Screen 4th Gen wRfx: NONREACTIVE

## 2019-11-20 LAB — FERRITIN, BLOOD: Ferritin: 92 ng/mL (ref 15–150)

## 2019-11-20 LAB — HEPATITIS C VIRUS (HCV) ANTIBODY WITH REFLEX TO QUANTITATIVE REAL-TIME PCR: Hepatitis C virus Ab Signal/Cutoff: <0.1 s/co ratio (ref 0.0–0.9)

## 2019-11-26 ENCOUNTER — Telehealth: Admitting: Family Medicine

## 2019-11-26 ENCOUNTER — Encounter (INDEPENDENT_AMBULATORY_CARE_PROVIDER_SITE_OTHER): Payer: Self-pay

## 2019-11-26 MED ORDER — IPRATROPIUM BROMIDE 0.03 % NA SOLN
2.0000 | Freq: Every evening | NASAL | 11 refills | Status: DC | PRN
Start: 2019-11-26 — End: 2021-08-30

## 2019-11-29 ENCOUNTER — Telehealth (INDEPENDENT_AMBULATORY_CARE_PROVIDER_SITE_OTHER): Payer: Self-pay | Admitting: Family Medicine

## 2019-11-29 MED ORDER — CYANOCOBALAMIN 1000 MCG/ML IJ KIT
PACK | INTRAMUSCULAR | Status: DC
Start: ? — End: 2021-08-30

## 2019-11-29 MED ORDER — XYZAL PO
Freq: Every evening | ORAL | Status: DC
Start: ? — End: 2020-07-14

## 2020-01-21 ENCOUNTER — Encounter (INDEPENDENT_AMBULATORY_CARE_PROVIDER_SITE_OTHER): Payer: Self-pay | Admitting: Internal Medicine

## 2020-01-21 DIAGNOSIS — Z0000 Encounter for general adult medical examination without abnormal findings: Secondary | ICD-10-CM

## 2020-01-22 ENCOUNTER — Encounter (INDEPENDENT_AMBULATORY_CARE_PROVIDER_SITE_OTHER): Payer: Self-pay

## 2020-06-22 ENCOUNTER — Telehealth (HOSPITAL_BASED_OUTPATIENT_CLINIC_OR_DEPARTMENT_OTHER): Payer: Self-pay

## 2020-06-22 NOTE — Telephone Encounter (Signed)
Need records to review for scheduling- Patient sees Dr Kathrine Haddock.

## 2020-06-22 NOTE — Telephone Encounter (Signed)
[facility] Call Center - External Referral - New Patient Appointment Request:    Incoming call received from: Pt     Patient is being referred to: Dr. Juleen China    Dx: prophylactic mastectomy  Referred by: Dr. Christian Mate  Referring MD Phone: [phone]    Registration Checklist Completed: yes    Advised to have medical records faxed to: [phone]  Best callback number: [phone]  [facility] to leave a detailed message: yes      Inform caller:    "Once the referral has been triaged by our clinical team, the provider's administrative assistant will contact the patient to offer an appointment."    "The Administrative Assistant will make a follow-up call if additional records, referral, or authorization are needed."    "The expected turnaround time is: 1-3 business days."

## 2020-06-30 ENCOUNTER — Other Ambulatory Visit: Payer: Self-pay

## 2020-06-30 NOTE — Telephone Encounter (Signed)
[facility] imaging reports received via rightfax - uploaded

## 2020-06-30 NOTE — Telephone Encounter (Signed)
Spoke with patient- history breast cancer and BRCA1+- would like to discuss prophylactic mastectomy - She also concerned about nipple mass.       Will see Nance Pew after her upcoming mammogram on 2/16- she will hand carry CD and I will request all outside past imaging.       Patient agreed.       Confirmed new patient consultation with patient  Has the patient been notified of:  Time of appt- yes  Date of appt- yes  Provider- yes  Location- yes  Visitor policy- yes     Requested interp- none, speaks english

## 2020-07-06 NOTE — Interdisciplinary (Signed)
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    SYMPTOMS    Symptoms in the past week: Feeling nervous : Unhappy with appearance of my   body : Feeling sad    Lynn Tran    Reason for a visit: Breast tenderness : Reconstructive surgery : Family   history or genetic risk of breast cancer  Last mammogram:  Less than 1 year ago  Location: [facility]  History of Invasive Breast Cancer: No    History of BC - type unknown: No  History of DCIS Diagnosis: Yes      * Location of Dx: Right breast      * Age: 5    Triple negative: No      Right Breast History:      * Symptoms (last 3 months): No, I haven't noticed any changes      * Symptoms (present today): N/A      * Procedures: Fine Needle Aspiration (FNA) : Implants : Breast   reconstruction : Radiation Therapy : Mastectomy : Lumpectomy for cancer :   Surgical biopsy : Core biopsy  Left Breast History:      * Symptoms (last 3 months): Pain : Other      * Symptoms (present today): Other      * Procedures: Fine Needle Aspiration (FNA) : Surgical biopsy : Core   biopsy      Number of breast biopsies: More than 1      Atypia:  Do not know      LCIS:  No      GENETIC TESTING    Person/Family Genetic Testing: Yes - I have Myself - BRCA1,  Relatives -   N/A               PAST MEDICAL HISTORY                                    Current conditions: Lung Disease (e.g., asthma, pulmonary fibrosis, etc.)                    Condition history: Being Overweight (Obesity) : Lung Disease (e.g., asthma,   pulmonary fibrosis, etc.) : Cancer (other than breast cancer)                    Number of Comorbidities: 3                  Cancer history: Breast Cancer :  Uterine (non-Cervical) / Endometrial Cancer   : Colon, Rectal, Large Intestine Cancer    GYNECOLOGICAL HISTORY    Age of first menstruation: 12           Has menstruation stopped: Yes - Uterus AND both ovaries removed by surgery             Hormonal birth control: Ever Taken:Yes - to prevent pregnancy  Yrs Taken: 1       Hormones ever taken: No - I have never been on any hormone therapy  Hormones currently taking: N/A  Pregnancies:      * G: 5;      * P: 0;      * Age at first birth: N/A       Future fertility: No       Fertility preservation: N/A       Interested in onco-fertility: N/A      SOCIAL HISTORY    Satisfaction with social support: Very much  Working status: Retired (not due to ill health); N/A  Tobacco use: No  Drinking frequency: 4 or more times per WEEK  Exercise:      * Walking: 4 - 6 times each week; 20 - 39 minutes; Average or normal   (2-3 miles an hour)                 * Strenuous exercise: none; N/A                                       * Moderate exercise: none; N/A                         * Mild exercise: none; N/A                     BMI: 28.4; 192 lbs: 5 feet   9 inches

## 2020-07-14 ENCOUNTER — Other Ambulatory Visit: Payer: Self-pay

## 2020-07-14 ENCOUNTER — Encounter (HOSPITAL_BASED_OUTPATIENT_CLINIC_OR_DEPARTMENT_OTHER): Payer: Self-pay | Admitting: Nurse Practitioner

## 2020-07-14 ENCOUNTER — Ambulatory Visit: Payer: Medicaid Other | Attending: Nurse Practitioner | Admitting: Nurse Practitioner

## 2020-07-14 VITALS — BP 115/66 | HR 89 | Temp 97.3°F | Resp 18 | Ht 69.0 in | Wt 195.1 lb

## 2020-07-14 DIAGNOSIS — Z1509 Genetic susceptibility to other malignant neoplasm: Secondary | ICD-10-CM | POA: Insufficient documentation

## 2020-07-14 DIAGNOSIS — Z1501 Genetic susceptibility to malignant neoplasm of breast: Secondary | ICD-10-CM | POA: Insufficient documentation

## 2020-07-14 DIAGNOSIS — Z853 Personal history of malignant neoplasm of breast: Secondary | ICD-10-CM

## 2020-07-14 DIAGNOSIS — Z9013 Acquired absence of bilateral breasts and nipples: Secondary | ICD-10-CM

## 2020-07-14 NOTE — Patient Instructions (Signed)
[facility] PHONE LIST FOR PATIENTS  Hours of operation: Monday-Friday 8:00 - 5:00pm, Closed Holidays and Weekends      AFTER HOURS EMERGENCY NUMBER: [phone] Ask for On-Call Surgeon for Surgical Symptoms. As for On-Call Medical Oncologist for Medical Symptoms     Administrative Assistant (Michelle Milo) for Dr. Anne Wallace: [phone]  Administrative Assistant (Samantha Salde) for Nurse Practitioner Vince Genna: [phone]       Nurse Case Managers for Dr. Anne Wallace and Nurse Practitioner Vince Genna: Janeen W., RN @ [phone] ,  Shauna W., RN @ [phone], Lucien Budney R., RN [phone]    Social Worker: Laurie Knight, LCSW Phone: [phone] or Betty A. Mc Cullough, LCSW, OSW-C Phone [phone]  [facility] support groups (open to any patients in [place] area:) http://continuingcare-[place].kp.org/Support_Home.html   American Cancer Society Breast [place] support groups: https://www.cancer.org/treatment/support-programs-and-services.html    Stage IV Zoom Breast Cancer Support group meets twice monthly; sign up with the following link: [email or web address]%3Dlist     Please note that due to recent changes in state laws, there may be times when your imaging reports are being released through Mychart before your provider team is able to review them. Please know we will review them by next business day and will contact you with results and recommendations.    ___________________________________________________________________   Information Desk: [phone]   · General information, directions, phone numbers, registration and other available services     Financial Counselors: [phone] at [facility] [phone] or [phone]     FOR INFORMATION REGARDING ADVANCE DIRECTIVES PLEASE VISIT    [email or web address]/[email or web address]-Pamphlet-[email or web address]  ______________________________________________________________________  Tell Us How We Did During Your Consultation/Follow Up Visit...  It is our priority to make sure that we are providing excellent patient care.   Your feedback goes a long way and makes a difference. If you would like to provide us feedback on how we did via telephone or  Email  E-mail: [email or web address]   Phone: [phone]      Thank you for the opportunity to care for you during this time.

## 2020-07-14 NOTE — Goals of Care (Signed)
Advance Care Planning        Who would make medical decisions for the patient if they are unable to make decisions for themselves?   Mark - significant other    Based on above information I recommended the following:  reviewing www.[email or web address]    Total time spent face-to-face with patient and/or surrogate decision maker providing counseling related to advance care planning:   2 minutes

## 2020-07-14 NOTE — Interdisciplinary (Signed)
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 Patient reports asymmetry to both breasts. Since her originally reconstruction surgery in 1984, patient reports she's had multiple surgeries of implant exchange with the most recent in 2010. Saw a Psychologist, sport and exercise at [facility] and also with Dr. 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 Patient notes her mother passed away last month.        Both Outside breast MRI completed 11/2019, and  mammogram 04/2019- negative. Reports she had a mammogram done yesterday and pt reports it was negative. No records on file yet - will request report.    Any pain, skin changes, nipple discharge? Not currently  Reports a pimple like cyst near her left areola. Patient expressed and popped area and had red streaks afterwards associated with pain and was given antibiotics by pcp. Patient notes lump is still there but symptoms resolved.    Patient also notes she had a scan of her pancreas for baseline and reports it was also negative    History:  Menarche: 12  ASN:[id number] Hysterectomy   Pregnancies:5  Children:0  HX Birth Control:<1 year pill   HX HRT:No   Breast Implants/Type:Right Breast 500cc gel filled Implant-under the muscle - reports she's had it replaced a few times  Hx of Biopsy/FNA: Yes    Smoking:No  Alcohol:4x/ week  Currently working?:Retired   Ashkenazi Ancestry? Possibly on dad's side  Genetic Testing?BRCA1, Genetics 07/2018  Med Hx: lung Disease, Obesity, Multiple cancers     Family History of Breast Cancer:  Mother at 21, recurrent at 62  P. Aunt, 70's P. Grandmother, 60's       Family History of Cancer:  Mother-Melanoma   M. Uncle- Colon Cancer       Medications, Medical History, Surgical History, and allergies updated and reviewed in EPIC    Plan:  - referral to med onc  - referral to pancreatic cancer prevention clinic  - RTC to see Dr. Juleen China for surgical planning at end of next month

## 2020-07-15 NOTE — Progress Notes (Signed)
Medical Record #: [id number]   DOB: September 17, 1955    Reason for Visit  Chief Complaint   Patient presents with    Family History Of Cancer        History of Present Illness:     Lynn Tran is a 65 year old female who is here for BRCA and discussion of surgical options.     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  She has a significant hx of breast cancer in 1984 which was infiltrating ductal carcinoma when she was age 77 and had a lumpectomy and radiation. She then had recurrence at age 65 and had a completion mastectomy with expander to implant reconstruction and adjuvant chemotherapy. She was offered endocrine therapy but she declined. She also notes at age 13 she had a TAH/BSO followed by radiation d/t a dx of poorly differentiated carcinosarcoma of the uterus. And in 2020 she was dx with rectal cancer and had a robotic assisted transanal excision of previous polypectomy site within distal rectum.     She notes she has struggled with significant asymmetry of her breasts with her R reconstruction changing over the years including having multiple revisions with her last in 2010 with implant exchange, and her L breast becoming much larger and ptotic. Saw a Psychologist, sport and exercise at [facility] and also with Dr. 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 Patient notes her mother passed away last month. Both Outside breast MRI completed 11/2019, and mammogram 04/2019- negative. Reports she had a mammogram done yesterday and pt reports it was negative. No records on file yet - will request report.  Reports a pimple like cyst near her left areola. Patient expressed and popped area and had red streaks afterwards associated with pain and was given  antibiotics by pcp and this helped it resolved.       Her medical hx and risk factors as follows:  Menarche:12  EHU:[id number]  Pregnancies:5  Children:0  HX Birth Control:<1 year pill  HX HRT:No  Breast Implants/Type:Right Breast 500cc gel filled Implant-under the muscle- reports she's had it replaced a few times  Hx of Biopsy/FNA:Yes  Smoking:No  Alcohol:4x/ week  Currently working?:Retired  Ashkenazi Ancestry? Possibly on dad's side  Genetic Testing?BRCA1, Genetics 07/2018  Med Hx: lung Disease, Obesity, Multiple cancers    Family History of Breast Cancer:  Mother at 64, recurrent at 79  P. Aunt, 70's P. Grandmother, 60's      Family History of Cancer:  Mother-Melanoma   M. Uncle- Colon Cancer      Past Medical History:   Diagnosis Date    Asthma     BRCA gene mutation positive in female     Breast cancer (CMS-HCC)     right    Ectopic pregnancy     Molar pregnancy     tx with metotrexate at age 60 yo     Rectal cancer (CMS-HCC)     Screening for cervical cancer 09/28/2018    09/25/2018 - s/p hysterectomy for uterine cancer; defer to GynOnc re: if pap needed though unlikely. 10/29/18 - message to Evert Kohl re: confirm no longer needing paps with h/o squamous metaplasia on cervical pathology 10/31/18 - no longer needs  paps confirmed.    Uterine cancer (CMS-HCC)          Past Surgical History:   Procedure Laterality Date    h/o  S/p robotic assisted transanal excision of previous polypectomy site within the distal rectum  05/2018    BREAST SURGERY      Lumpectomy at 65 yo followed by masectomy and reconstruction at age 60 for a recurrence    H/o ectopic pregnancy      H/o Tah Bso      1998    TONSILLECTOMY AND ADENOIDECTOMY           Family History   Problem Relation Name Age of Onset    Breast Cancer Mother  75        Recurrence 73    Melanoma Cancer Mother  17    Heart Attack Father  74    Breast Cancer Paternal Grandfather          Diagnosed in 73s    Colon Cancer  Maternal Uncle          4s    Other Maternal Uncle          Cushing's    Breast Cancer Paternal Aunt          23s       Allergies  Allergies   Allergen Reactions    Adhesive Tape Rash    Dmdm Hydantoin Hives    Ancef [Cefazolin] Other     Delayed urticaria; significant uncertainly as may have been due to concurrently administered opioid    Codeine Nausea and Vomiting    Morphine Nausea and Vomiting    [Other] Other     Positive patch testing to:  Mercapto mix  quaternium 15  Imidazolidinyl urea  Neomycin  parthenolide  formaldehyde         Current Outpatient Medications   Medication Sig Dispense Refill    albuterol 108 (90 Base) MCG/ACT inhaler Inhale 2 puffs by mouth every 6 hours as needed for Wheezing or Shortness of Breath. 8.5 g 11    Biotin 1 MG CAPS       budesonide-formoterol (SYMBICORT) 80-4.5 MCG/ACT inhaler inhale 2 puffs by mouth twice a day 30.6 g 3    Cholecalciferol (VITAMIN D3) 1000 units tablet 4,000 Int'l Units.      CINNAMON PO       clobetasol propionate (TEMOVATE) 0.05 % cream Apply 1 Application topically 2 times daily. Apply to affected area twice a day for recurrent contact dermatitis. 30 g 1    Coenzyme Q10 (CO Q 10 PO)       COLLAGEN PO       Cyanocobalamin 1000 MCG/ML KIT Through weight clinic      diclofenac (VOLTAREN) 1 % gel Apply 4 g topically 4 times daily. Use the plastic dose guide found inside the box to measure the dose. 1 Tube 1    Glucosamine-MSM-Hyaluronic Acd (JO[facility]ALTH PO)       ibuprofen (MOTRIN) 800 MG tablet Take 1 tablet (800 mg) by mouth every 8 hours as needed for Mild Pain (Pain Score 1-3). Refilled require visit. 30 tablet 0    ipratropium (ATROVENT) 0.03 % nasal spray Spray 2 sprays into each nostril At bedtime as needed for Rhinitis. 1 bottle 11    Milk Thistle 250 MG CAPS       omeprazole (PRILOSEC) 20 MG capsule Take 1 capsule (20 mg) by mouth daily. 90 capsule 3     No  current facility-administered medications for this visit.          Social History     Socioeconomic History    Marital status: Separated     Spouse name: Not on file    Number of children: Not on file    Years of education: Not on file    Highest education level: Not on file   Occupational History    Not on file   Tobacco Use    Smoking status: Never Smoker    Smokeless tobacco: Never Used   Substance and Sexual Activity    Alcohol use: Yes     Alcohol/week: 14.0 standard drinks     Types: 14 Standard drinks or equivalent per week    Drug use: No    Sexual activity: Not Currently     Partners: Male   Social Activities of Daily Living Present    Not on file   Social History Narrative    Not on file     Social History     Tobacco Use   Smoking Status Never Smoker   Smokeless Tobacco Never Used     Social History     Substance and Sexual Activity   Alcohol Use Yes    Alcohol/week: 14.0 standard drinks    Types: 14 Standard drinks or equivalent per week     Social History     Substance and Sexual Activity   Drug Use No       Problem List  Patient Active Problem List   Diagnosis    Gastroesophageal reflux disease    Mild persistent asthma without complication    Contact dermatitis    Herpes simplex type 1 infection    History of torn meniscus of left knee    History of breast cancer    History of uterine cancer    History of cancer    Lesion of skin of foot    History of urticaria    Chronic pain of left knee    History of rectal cancer    FH: melanoma    BRCA1 positive    Encounter to establish care    Adverse effect of bisphosphonates    Vitamin D deficiency    Elevated glucose    Skin lesion    Elevated blood pressure reading without diagnosis of hypertension    Cold sore    Insomnia    Encounter for herb and vitamin supplement management    Alcohol use    Snores    Complex care coordination    Abnormal LFTs    Dyslipidemia    Postnasal drip       Review of Systems    Pertinent items are noted in HPI.    Physical Exam  Patient is a  65 year old female who appeared alert, cooperative  BP 115/66 (BP Location: Left arm, BP Patient Position: Sitting, BP cuff size: Regular)    Pulse 89    Temp 97.3 F (36.3 C) (Temporal)    Resp 18    Ht '5\' 9"'  (1.753 m)    Wt 88.5 kg (195 lb 1.7 oz)    SpO2 100%    BMI 28.81 kg/m   Body mass index is 28.81 kg/m.  General Appearance: healthy, alert, no distress, pleasant affect, cooperative.  Breast: L inspection negative. No nipple discharge, bleeding or masses, with large and ptotic shape. R breast s/p mastectomy with well healed scars and palpated implant in place with no palpable masses  or adenopathy.    Medical Decision Making  Test Results:  Results for orders placed or performed in visit on 11/16/19   CBC w/ Diff Lavender   Result Value Ref Range    WBC 4.0 3.4 - 10.8 x10E3/uL    RBC 4.16 3.77 - 5.28 x10E6/uL    Hemoglobin 13.5 11.1 - 15.9 g/dL    Hematocrit 39.3 34.0 - 46.6 %    MCV 95 79 - 97 fL    MCH 32.5 26.6 - 33.0 pg    MCHC 34.4 31.5 - 35.7 g/dL    RDW 13.1 11.7 - 15.4 %    Platelets 298 150 - 450 x10E3/uL    Neutrophils 44 Not Estab. %    Lymphs 40 Not Estab. %    Monocytes 12 Not Estab. %    Eos 3 Not Estab. %    Basos 1 Not Estab. %    Neutrophils (Absolute) 1.8 1.4 - 7.0 x10E3/uL    Lymphs (Absolute) 1.6 0.7 - 3.1 x10E3/uL    Monocytes(Absolute) 0.5 0.1 - 0.9 x10E3/uL    Eos (Absolute) 0.1 0.0 - 0.4 x10E3/uL    Baso (Absolute) 0.1 0.0 - 0.2 x10E3/uL    Immature Granulocytes 0 Not Estab. %    Immature Grans (Abs) 0.0 0.0 - 0.1 x10E3/uL   Comprehensive Metabolic Panel   Result Value Ref Range    Glucose 88 65 - 99 mg/dL    BUN 16 8 - 27 mg/dL    Creatinine 0.88 0.57 - 1.00 mg/dL    eGFR If NonAfricn Am 70 >59 mL/min/1.73    eGFR If Africn Am 80 >59 mL/min/1.73    BUN/Creatinine Ratio 18 12 - 28    Sodium 144 134 - 144 mmol/L    Potassium 4.7 3.5 - 5.2 mmol/L    Chloride 108 (H) 96 - 106 mmol/L    Carbon Dioxide 21 20 - 29 mmol/L    Calcium 9.5 8.7 - 10.3 mg/dL    Protein, Total, Serum 6.5 6.0 -  8.5 g/dL    Albumin 4.4 3.8 - 4.8 g/dL    Globulin, Total 2.1 1.5 - 4.5 g/dL    A/G Ratio 2.1 1.2 - 2.2    Bilirubin, Total 0.5 0.0 - 1.2 mg/dL    Alkaline Phos 73 48 - 121 IU/L    AST 28 0 - 40 IU/L    ALT (SGPT) 27 0 - 32 IU/L   CEA Tumor - See Instructions   Result Value Ref Range    CEA 1.8 0.0 - 4.7 ng/mL   Hepatitis C Ab w/reflex ([facility])   Result Value Ref Range    Hepatitis C virus Ab Signal/Cutoff <0.1 0.0 - 0.9 s/co ratio   HIV   Result Value Ref Range    HIV Screen 4th Gen wRfx Non Reactive Non Reactive   Glycosylated Hgb(A1C), Blood Lavender   Result Value Ref Range    Hemoglobin A1c 5.3 4.8 - 5.6 %   Lipid Panel Green Plasma Separator Tube   Result Value Ref Range    Cholesterol 212 (H) 100 - 199 mg/dL    Triglycerides 65 0 - 149 mg/dL    HDL Cholesterol 64 >39 mg/dL    VLDL Cholesterol CAL 12 5 - 40 mg/dL    LDL Chol CAL (NIH) - [facility] 136 (H) 0 - 99 mg/dL    Non-HDL Cholesterol 148 (H) 0 - 129 mg/dL   Vitamin D, 25-OH Total Yellow serum separator tube   Result  Value Ref Range    Vitamin D, 25-Hydroxy 42.1 30.0 - 100.0 ng/mL   Gamma Glutamyl Transferase, Blood Green Plasma Separator Tube   Result Value Ref Range    GGT 21 0 - 60 IU/L   Vitamin B12, Blood Green Plasma Separator Tube   Result Value Ref Range    Vitamin B12 1,934 (H) 232 - 1,245 pg/mL   TSH, Blood Green Plasma Separator Tube   Result Value Ref Range    TSH 2.860 0.450 - 4.500 uIU/mL   Iron/IBC Panel   Result Value Ref Range    Iron Binding Capacity (TIBC) 316 250 - 450 ug/dL    UIBC 198 118 - 369 ug/dL    Iron, Serum 118 27 - 139 ug/dL    Iron Saturation 37 15 - 55 %   Ferritin, Blood Green Plasma Separator Tube   Result Value Ref Range    Ferritin 92 15 - 150 ng/mL   Urinalysis   Result Value Ref Range    Specific Gravity 1.017 1.005 - 1.030    pH 5.5 5.0 - 7.5    Urine-Color Yellow Yellow    Appearance Clear Clear    Leuk Esterase Negative Negative    Protein Negative Negative/Trace    Glucose Negative Negative    Ketones Negative  Negative    Blood Negative Negative    Bilirubin Negative Negative    Urobilinogen,Semi-Qn 0.2 0.2 - 1.0 mg/dL    Nitrite Negative Negative    Microscopic Examination Comment     Microscopic Examination See below:    Urinalysis Microscopic Only, Random Urine   Result Value Ref Range    WBC 0-5 0 - 5 /hpf    RBC None seen 0 - 2 /hpf    Epithelial Cells (non renal) None seen 0 - 10 /hpf    Casts None seen None seen /lpf    Bacteria None seen None seen/Few   Interpretation   Result Value Ref Range    Interpretation - [facility] Comment        Impression:  Lynn Tran has a significant hx of breast cancer x 2 and uterine cancer and rectal cancer. She notes she recently learned of her BRCA1+ status and she has wanted to have a mastectomy for risk reduction, but admits her main consideration is to better balance her breasts and she is very asymmetric.     Mastectomy nipple sparing is an option for prevention. This would lower her risk by over 90% for developing breast cancer. With the nipple sparing surgery the nipple is skinned off the breast leaving very little vascularization (in order to excise all ductal elements). Therefore, there is some risk of nipple loss. However, if the nipple survives there is excellent cosmesis due to the shape achieved with leaving the nipple/areolar complex.    We discussed different reconstruction options. Flaps versus expanders were explained.  Expander/implant reconstruction was first re-discussed. This involves placing an expander under the muscle and expanding with saline several times in clinic.  At a second operation the expander is replaced with a permanent implant. I do not prefer the use of alloderm for one stage reconstruction.  I feel in the long run the bottom portion of the implant becomes very palpable, there is often a window shading of muscle superior to the ADM and in the immediate setting uncertainty with the viability of the mastectomy skin. So my approach is still complete  muscle coverage over the expander and sometimes direct to implant if  the muscle and skin are suitable.  I have also not embraced prepectoral implants as the skin flap is thin, the implant has little coverage and the entire reconstruction hangs low on the chest wall.  There is also rippling and other cosmetic issues I have already seen in follow up of patients.  Finally, there is no data to the efficacy of radiation when the prepectoral implants are in place, as we don't know if the chest wall is getting adequate dosage.  Thus, other than in select cases I will chose to continue submuscular placement.  We control pain with the paravertebral block catheters and our long term postmastectomy pain rate is 10% versus the literature at 40%. We also discussed flap surgery which involves transferring tissue from either the abdomen, back, or buttocks with or without an implant. TRAM, DIEP , latissimus and gluteal flaps were discussed.     After review of this and further discussion of her goal, with her age being 72 now and many years out from her breast cancers and with her recent colon cancer, we discussed other options to achieve her goals with less surgical intervention and recovery.   We discussed that she could consider a large breast reduction which can lower her cancer risk and can help with her symmetry.   We discussed doing a wise pattern reduction in detail. Risks include hematoma, infection, pain, scar, asysmmetry, nipple necrosis, nipple sensation changes, interference with breast feedling for the premenopausal female, contour problems, fat necrosis, wound healing problems, interference with mammogram, size to big or too small, etc. The scar is in the form of an anchor.    She didn't realize that this was an option and was interested in having a surgery that was easier recovery with also less secondary surgical steps.     She will RTC to meet with Dr Juleen China to further discuss her options as she reads more about  what we discussed.     She also asked for referrals to our survivorship clinic, as she changed her insurance and her previous medical oncologist is no longer in network. She also asked for a referral to the pancreatic cancer screening clinic     Time spent in this visit 47 minutes plus time spent to review her medical hx and outside records.     The risks, benefits and alternatives of the planned course of care have been discussed with the patient and/or her legal representative, all questions have been answered and they agree to proceed.    Note Author: Jetty Peeks, NP

## 2020-07-19 ENCOUNTER — Telehealth (HOSPITAL_BASED_OUTPATIENT_CLINIC_OR_DEPARTMENT_OTHER): Payer: Self-pay

## 2020-07-19 NOTE — Telephone Encounter (Signed)
Patient referred to Pancreatic Cancer Prevention Clinic for BRCA1    Patient does not have any family history of pancreatic cancer and had a recent baseline MRI with an unremarkable pancreas.    She will reach out to me for any significant changes in her family history otherwise she is not considered a surveillance candidate.    No further questions or concerns.

## 2020-08-14 ENCOUNTER — Telehealth (HOSPITAL_BASED_OUTPATIENT_CLINIC_OR_DEPARTMENT_OTHER): Payer: Self-pay

## 2020-08-14 NOTE — Telephone Encounter (Signed)
Per notes: Patient was diagnosed at age 65, patient is now 71.   37 years from diagnosis.   Routing to NP/MD to advise.

## 2020-08-14 NOTE — Telephone Encounter (Signed)
Patient has been referred by Nance Pew to breast medical oncology.    Dx: hx of breast cancer and BRCA, pt moved insurances and asking to establish with survivorship clinic    Ref date: 07/14/2020    Authorized (just now auth'd)

## 2020-08-15 NOTE — Telephone Encounter (Signed)
Per Dr. Kellie Moor, okay for Lynn Tran to follow.   Call placed to patient to schedule new patient consultation.   Offered multiple dates and times, patient declined.   Patient confused over referral to survivorship- states it was never discussed with her.   Explained survivorship clinic and patient is now in agreement to schedule.   Scheduled for 4/8 at 1pm per patient request, is aware of address and location.

## 2020-08-22 ENCOUNTER — Ambulatory Visit: Payer: Medicaid Other | Attending: Plastic Surgery | Admitting: Plastic Surgery

## 2020-08-22 VITALS — BP 132/73 | HR 97 | Temp 97.0°F | Resp 16 | Ht 69.0 in | Wt 192.2 lb

## 2020-08-22 DIAGNOSIS — Z421 Encounter for breast reconstruction following mastectomy: Secondary | ICD-10-CM | POA: Insufficient documentation

## 2020-08-22 DIAGNOSIS — N6489 Other specified disorders of breast: Secondary | ICD-10-CM

## 2020-08-22 DIAGNOSIS — N62 Hypertrophy of breast: Secondary | ICD-10-CM | POA: Insufficient documentation

## 2020-08-22 DIAGNOSIS — Z1509 Genetic susceptibility to other malignant neoplasm: Secondary | ICD-10-CM | POA: Insufficient documentation

## 2020-08-22 DIAGNOSIS — Z1501 Genetic susceptibility to malignant neoplasm of breast: Secondary | ICD-10-CM | POA: Insufficient documentation

## 2020-08-22 NOTE — Interdisciplinary (Signed)
Follow Up Visit with Dr Juleen China for surgical planning  [phone] Breast Infiltrating Ductal Cancer, S/P Lumpectomy and XRT at age 65, Reoccurrence at age 65 S/P Mastectomy with reconand adjuvant chemo- offered endocrine therapy but patient declined    H/O:poorly differentiated carcinosarcoma of the uterus at age 65, s/p TAH/BSO- followed by radiation    H/O: 2020 Rectal cancers/p robotic assisted transanal excision of previous polypectomy site within distal rectum    BRCA 1 - recent testing    Assessment:  Consult with Vince on 2/17 - discussed large left breast reduction instead of prophylactic mastectomy    Concerns about the risk reduction with choosing a reduction vs mastectomy.Also wants reconstruction revision at the same time. Has brought photos to show. Currently has a 525m gel implant that has previously been revised for suspected leaking     Insurance changes May 1, hoping to get on OR schedule in April     Medications and allergies updated and reviewed in EPIC    MD Plan:  -consent for L breast free nipple graft technique wise patter breast reduction and R breast revision with implant removal and placement of silicone implant for symmetry.  -Pre-Op teaching by this RN   -Photos sent for auth   -Implant consent signed   -RTC post op once scheduled

## 2020-08-22 NOTE — Goals of Care (Signed)
Advance Care Planning    What gives the patient's life meaning?      Patient would be willing to endure aggressive medical therapies as long as they could still:       Who would make medical decisions for the patient if they are unable to make decisions for themselves?   [employer] on above information I recommended the following:  reviewing [email or web address]    Total time spent face-to-face with patient and/or surrogate decision maker providing counseling related to advance care planning:    minutes

## 2020-08-22 NOTE — Patient Instructions (Signed)
[facility] PHONE LIST FOR PATIENTS  Hours of operation: Monday-Friday 8:00 - 5:00pm, Closed Holidays and Weekends      AFTER HOURS EMERGENCY NUMBER: [phone] Ask for On-Call Surgeon for Surgical Symptoms. As for On-Call Medical Oncologist for Medical Symptoms     Administrative Assistant (Michelle Milo) for Dr. Anne Wallace: [phone]  Administrative Assistant (Samantha Salde) for Nurse Practitioner Vince Genna: [phone]     Nurse Case Managers Laurinda Carreno W., RN @ [phone] ,  Shauna W., RN @ [phone], Bona R., RN [phone]    +++++++++++++++++++++++++++++++++++++++++++++++++++++++++++++++++++++++++++++++++++++++  If you would like to provide us feedback on how we did, you can contact the Patient Experience 'We Listen' Department via telephone, Email, or by mail.    E-mail: [email or web address] Phone: [phone]   Mail:Patient Experience  [facility] , [address], [address], [place], [place] [address]      Thank you for the opportunity to care for you during this time.

## 2020-08-22 NOTE — Progress Notes (Signed)
Medical Record #: [id number]   DOB: [birth year]    Reason for Visit  Chief Complaint   Patient presents with    History Of Breast Cancer        History of Present Illness:     Lynn Tran is a 65 year old female who is here for BRCA and discussion of surgical options.     She returns to clinic to discuss her options for prevention and improving her symmetry. 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  She has a significant hx of breast cancer in 1984 which was infiltrating ductal carcinoma when she was age 63 and had a lumpectomy and radiation. She then had recurrence at age 37 and had a completion mastectomy with expander to implant reconstruction and adjuvant chemotherapy. She was offered endocrine therapy but she declined. She also notes at age 4 she had a TAH/BSO followed by radiation d/t a dx of poorly differentiated carcinosarcoma of the uterus. And in 2020 she was dx with rectal cancer and had a robotic assisted transanal excision of previous polypectomy site within distal rectum.     She notes she has struggled with significant asymmetry of her breasts with her R reconstruction changing over the years including having multiple revisions with her last in 2010 with implant exchange, and her L breast becoming much larger and ptotic. Saw a Psychologist, sport and exercise at [facility] and also with Dr. 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 Patient notes her mother passed away last month. Both Outside breast MRI completed 11/2019, and mammogram 04/2019- negative. Reports she had a mammogram done yesterday and pt reports it was negative. No records on file yet - will request report.  Reports a pimple like cyst near her left areola. Patient expressed  and popped area and had red streaks afterwards associated with pain and was given antibiotics by pcp and this helped it resolved.     At her previous visit, it was discussed with her to consider a L breast reduction to improve her symmetry to better match her R and to help lower her risk which would be an easier recovery than a L breast mastectomy with reconstruction.       Her medical hx and risk factors as follows:  Menarche:12  IOE:[id number]  Pregnancies:5  Children:0  HX Birth Control:<1 year pill  HX HRT:No  Breast Implants/Type:Right Breast 500cc gel filled Implant-under the muscle- reports she's had it replaced a few times  Hx of Biopsy/FNA:Yes  Smoking:No  Alcohol:4x/ week  Currently working?:Retired  Ashkenazi Ancestry? Possibly on dad's side  Genetic Testing?BRCA1, Genetics 07/2018  Med Hx: lung Disease, Obesity, Multiple cancers    Family History of Breast Cancer:  Mother at 36, recurrent at 73  P. Aunt, 70's P. Grandmother, 60's      Family History of Cancer:  Mother-Melanoma   M. Uncle- Colon Cancer      Past Medical History:   Diagnosis Date    Asthma     BRCA gene mutation positive in female     Breast cancer (CMS-HCC)     right    Ectopic pregnancy     Molar pregnancy     tx with metotrexate at  age 12 yo     Rectal cancer (CMS-HCC)     Screening for cervical cancer 09/28/2018    09/25/2018 - s/p hysterectomy for uterine cancer; defer to GynOnc re: if pap needed though unlikely. 10/29/18 - message to Evert Kohl re: confirm no longer needing paps with h/o squamous metaplasia on cervical pathology 10/31/18 - no longer needs paps confirmed.    Uterine cancer (CMS-HCC)          Past Surgical History:   Procedure Laterality Date    h/o  S/p robotic assisted transanal excision of previous polypectomy site within the distal rectum  05/2018    BREAST SURGERY      Lumpectomy at 65 yo followed by masectomy and reconstruction at age 26 for a recurrence    H/o ectopic  pregnancy      H/o Tah Bso      1998    TONSILLECTOMY AND ADENOIDECTOMY           Family History   Problem Relation Name Age of Onset    Breast Cancer Mother  87        Recurrence 34    Melanoma Cancer Mother  5    Heart Attack Father  30    Breast Cancer Paternal Grandfather          Diagnosed in 36s    Colon Cancer Maternal Uncle          11s    Other Maternal Uncle          Cushing's    Breast Cancer Paternal Aunt          24s       Allergies  Allergies   Allergen Reactions    Adhesive Tape Rash    Dmdm Hydantoin Hives    Ancef [Cefazolin] Other     Delayed urticaria; significant uncertainly as may have been due to concurrently administered opioid    Codeine Nausea and Vomiting    Morphine Nausea and Vomiting    [Other] Other     Positive patch testing to:  Mercapto mix  quaternium 15  Imidazolidinyl urea  Neomycin  parthenolide  formaldehyde         Current Outpatient Medications   Medication Sig Dispense Refill    albuterol 108 (90 Base) MCG/ACT inhaler Inhale 2 puffs by mouth every 6 hours as needed for Wheezing or Shortness of Breath. 8.5 g 11    Biotin 1 MG CAPS       budesonide-formoterol (SYMBICORT) 80-4.5 MCG/ACT inhaler inhale 2 puffs by mouth twice a day 30.6 g 3    Cholecalciferol (VITAMIN D3) 1000 units tablet 4,000 Int'l Units.      CINNAMON PO       clobetasol propionate (TEMOVATE) 0.05 % cream Apply 1 Application topically 2 times daily. Apply to affected area twice a day for recurrent contact dermatitis. 30 g 1    Coenzyme Q10 (CO Q 10 PO)       COLLAGEN PO       Cyanocobalamin 1000 MCG/ML KIT Through weight clinic      diclofenac (VOLTAREN) 1 % gel Apply 4 g topically 4 times daily. Use the plastic dose guide found inside the box to measure the dose. 1 Tube 1    Glucosamine-MSM-Hyaluronic Acd (JO[facility]ALTH PO)       ibuprofen (MOTRIN) 800 MG tablet Take 1 tablet (800 mg) by mouth every 8 hours as needed for Mild Pain (Pain Score 1-3). Refilled require visit.  30 tablet  0    ipratropium (ATROVENT) 0.03 % nasal spray Spray 2 sprays into each nostril At bedtime as needed for Rhinitis. 1 bottle 11    Milk Thistle 250 MG CAPS       omeprazole (PRILOSEC) 20 MG capsule Take 1 capsule (20 mg) by mouth daily. 90 capsule 3     No current facility-administered medications for this visit.         Social History     Socioeconomic History    Marital status: Separated     Spouse name: Not on file    Number of children: Not on file    Years of education: Not on file    Highest education level: Not on file   Occupational History    Not on file   Tobacco Use    Smoking status: Never Smoker    Smokeless tobacco: Never Used   Substance and Sexual Activity    Alcohol use: Yes     Alcohol/week: 14.0 standard drinks     Types: 14 Standard drinks or equivalent per week    Drug use: No    Sexual activity: Not Currently     Partners: Male   Social Activities of Daily Living Present    Not on file   Social History Narrative    Not on file     Social History     Tobacco Use   Smoking Status Never Smoker   Smokeless Tobacco Never Used     Social History     Substance and Sexual Activity   Alcohol Use Yes    Alcohol/week: 14.0 standard drinks    Types: 14 Standard drinks or equivalent per week     Social History     Substance and Sexual Activity   Drug Use No       Problem List  Patient Active Problem List   Diagnosis    Gastroesophageal reflux disease    Mild persistent asthma without complication    Contact dermatitis    Herpes simplex type 1 infection    History of torn meniscus of left knee    History of breast cancer    History of uterine cancer    History of cancer    Lesion of skin of foot    History of urticaria    Chronic pain of left knee    History of rectal cancer    FH: melanoma    BRCA1 positive    Encounter to establish care    Adverse effect of bisphosphonates    Vitamin D deficiency    Elevated glucose    Skin lesion    Elevated blood pressure reading  without diagnosis of hypertension    Cold sore    Insomnia    Encounter for herb and vitamin supplement management    Alcohol use    Snores    Complex care coordination    Abnormal LFTs    Dyslipidemia    Postnasal drip     REVIEW OF SYSTEMS:  Constitutional:No weight gain or loss  HEENT: No visual changes, nasal congestion, throat pain  Pulmonary: No cough or sputum, No SOB  Cardiac: No chest pain or palpitations  GI:No nausea, vomiting, diarrhea, constipation  GU:No hematuria, dysuria, oliguria  GYN: No abnormal bleeding or pelvic pain  Ext: No swelling or trauma  Skin: No worrisome or new lesions  Neuro: No dizziness or fainting  Psych: No new mood swings, depression or sleep problems  PHYSICAL EXAMINATION:BP 132/73 (BP Location: Left arm, BP Patient Position: Sitting, BP cuff size: Regular)    Pulse 97    Temp 97 F (36.1 C) (Temporal)    Resp 16    Ht _0  (1.753 m)    Wt 87.2 kg (192 lb 3.9 oz)    SpO2 98%    BMI 28.39 kg/m   This is a WDWN female in NAD  Lungs:  clear to speech  Heart:  RRR   HEENT: PEERL, EOMI  Breast exam: extremely large pendulous left breast that Is very asymmetric with the right reconstruction that has poor shape and projection but is soft.  No mass or adenoapthy  Abdominal exam: no hepato/splenomegaly; tenderness or mass  Extremities: no evidence of trauma, edema, or bony prominence tenderness  Skin: not notable for worrisome lesion or rash  Neurologically:  the patient is oriented times three and cranial nerves II-XII are grossly intact.      Medical Decision Making  Test Results:  Results for orders placed or performed in visit on 11/16/19   CBC w/ Diff Lavender   Result Value Ref Range    WBC 4.0 3.4 - 10.8 x10E3/uL    RBC 4.16 3.77 - 5.28 x10E6/uL    Hemoglobin 13.5 11.1 - 15.9 g/dL    Hematocrit 39.3 34.0 - 46.6 %    MCV 95 79 - 97 fL    MCH 32.5 26.6 - 33.0 pg    MCHC 34.4 31.5 - 35.7 g/dL    RDW 13.1 11.7 - 15.4 %    Platelets 298 150 - 450 x10E3/uL    Neutrophils  44 Not Estab. %    Lymphs 40 Not Estab. %    Monocytes 12 Not Estab. %    Eos 3 Not Estab. %    Basos 1 Not Estab. %    Neutrophils (Absolute) 1.8 1.4 - 7.0 x10E3/uL    Lymphs (Absolute) 1.6 0.7 - 3.1 x10E3/uL    Monocytes(Absolute) 0.5 0.1 - 0.9 x10E3/uL    Eos (Absolute) 0.1 0.0 - 0.4 x10E3/uL    Baso (Absolute) 0.1 0.0 - 0.2 x10E3/uL    Immature Granulocytes 0 Not Estab. %    Immature Grans (Abs) 0.0 0.0 - 0.1 x10E3/uL   Comprehensive Metabolic Panel   Result Value Ref Range    Glucose 88 65 - 99 mg/dL    BUN 16 8 - 27 mg/dL    Creatinine 0.88 0.57 - 1.00 mg/dL    eGFR If NonAfricn Am 70 >59 mL/min/1.73    eGFR If Africn Am 80 >59 mL/min/1.73    BUN/Creatinine Ratio 18 12 - 28    Sodium 144 134 - 144 mmol/L    Potassium 4.7 3.5 - 5.2 mmol/L    Chloride 108 (H) 96 - 106 mmol/L    Carbon Dioxide 21 20 - 29 mmol/L    Calcium 9.5 8.7 - 10.3 mg/dL    Protein, Total, Serum 6.5 6.0 - 8.5 g/dL    Albumin 4.4 3.8 - 4.8 g/dL    Globulin, Total 2.1 1.5 - 4.5 g/dL    A/G Ratio 2.1 1.2 - 2.2    Bilirubin, Total 0.5 0.0 - 1.2 mg/dL    Alkaline Phos 73 48 - 121 IU/L    AST 28 0 - 40 IU/L    ALT (SGPT) 27 0 - 32 IU/L   CEA Tumor - See Instructions   Result Value Ref Range    CEA 1.8 0.0 - 4.7 ng/mL  Hepatitis C Ab w/reflex ([facility])   Result Value Ref Range    Hepatitis C virus Ab Signal/Cutoff <0.1 0.0 - 0.9 s/co ratio   HIV   Result Value Ref Range    HIV Screen 4th Gen wRfx Non Reactive Non Reactive   Glycosylated Hgb(A1C), Blood Lavender   Result Value Ref Range    Hemoglobin A1c 5.3 4.8 - 5.6 %   Lipid Panel Green Plasma Separator Tube   Result Value Ref Range    Cholesterol 212 (H) 100 - 199 mg/dL    Triglycerides 65 0 - 149 mg/dL    HDL Cholesterol 64 >39 mg/dL    VLDL Cholesterol CAL 12 5 - 40 mg/dL    LDL Chol CAL (NIH) - [facility] 136 (H) 0 - 99 mg/dL    Non-HDL Cholesterol 148 (H) 0 - 129 mg/dL   Vitamin D, 25-OH Total Yellow serum separator tube   Result Value Ref Range    Vitamin D, 25-Hydroxy 42.1 30.0 - 100.0 ng/mL    Gamma Glutamyl Transferase, Blood Green Plasma Separator Tube   Result Value Ref Range    GGT 21 0 - 60 IU/L   Vitamin B12, Blood Green Plasma Separator Tube   Result Value Ref Range    Vitamin B12 1,934 (H) 232 - 1,245 pg/mL   TSH, Blood Green Plasma Separator Tube   Result Value Ref Range    TSH 2.860 0.450 - 4.500 uIU/mL   Iron/IBC Panel   Result Value Ref Range    Iron Binding Capacity (TIBC) 316 250 - 450 ug/dL    UIBC 198 118 - 369 ug/dL    Iron, Serum 118 27 - 139 ug/dL    Iron Saturation 37 15 - 55 %   Ferritin, Blood Green Plasma Separator Tube   Result Value Ref Range    Ferritin 92 15 - 150 ng/mL   Urinalysis   Result Value Ref Range    Specific Gravity 1.017 1.005 - 1.030    pH 5.5 5.0 - 7.5    Urine-Color Yellow Yellow    Appearance Clear Clear    Leuk Esterase Negative Negative    Protein Negative Negative/Trace    Glucose Negative Negative    Ketones Negative Negative    Blood Negative Negative    Bilirubin Negative Negative    Urobilinogen,Semi-Qn 0.2 0.2 - 1.0 mg/dL    Nitrite Negative Negative    Microscopic Examination Comment     Microscopic Examination See below:    Urinalysis Microscopic Only, Random Urine   Result Value Ref Range    WBC 0-5 0 - 5 /hpf    RBC None seen 0 - 2 /hpf    Epithelial Cells (non renal) None seen 0 - 10 /hpf    Casts None seen None seen /lpf    Bacteria None seen None seen/Few   Interpretation   Result Value Ref Range    Interpretation - [facility] Comment        Impression:  Ysabel has a significant hx of breast cancer x 2 and uterine cancer and rectal cancer. She notes she recently learned of her BRCA1+ status and she has wanted to have a mastectomy for risk reduction, but admits her main consideration is to better balance her breasts and she is very asymmetric.     Mastectomy nipple sparing is an option for prevention. This would lower her risk by over 90% for developing breast cancer. With the nipple sparing surgery the nipple is skinned off  the breast leaving very  little vascularization (in order to excise all ductal elements). Therefore, there is some risk of nipple loss. However, if the nipple survives there is excellent cosmesis due to the shape achieved with leaving the nipple/areolar complex.    We discussed different reconstruction options. Flaps versus expanders were explained.  Expander/implant reconstruction was first re-discussed. This involves placing an expander under the muscle and expanding with saline several times in clinic.  At a second operation the expander is replaced with a permanent implant. I do not prefer the use of alloderm for one stage reconstruction.  I feel in the long run the bottom portion of the implant becomes very palpable, there is often a window shading of muscle superior to the ADM and in the immediate setting uncertainty with the viability of the mastectomy skin. So my approach is still complete muscle coverage over the expander and sometimes direct to implant if the muscle and skin are suitable.  I have also not embraced prepectoral implants as the skin flap is thin, the implant has little coverage and the entire reconstruction hangs low on the chest wall.  There is also rippling and other cosmetic issues I have already seen in follow up of patients.  Finally, there is no data to the efficacy of radiation when the prepectoral implants are in place, as we don't know if the chest wall is getting adequate dosage.  Thus, other than in select cases I will chose to continue submuscular placement.  We control pain with the paravertebral block catheters and our long term postmastectomy pain rate is 10% versus the literature at 40%. We also discussed flap surgery which involves transferring tissue from either the abdomen, back, or buttocks with or without an implant. TRAM, DIEP , latissimus and gluteal flaps were discussed.     After review of this and further discussion of her goal, with her age being 25 now and many years out from her breast  cancers and with her recent colon cancer, we discussed other options to achieve her goals with less surgical intervention and recovery.   We discussed that she could consider a large breast reduction which can lower her cancer risk and can help with her symmetry.   We discussed doing a wise pattern reduction in detail. Risks include hematoma, infection, pain, scar, asysmmetry, nipple necrosis, nipple sensation changes, interference with breast feedling for the premenopausal female, contour problems, fat necrosis, wound healing problems, interference with mammogram, size to big or too small, etc. The scar is in the form of an anchor. I would do a free nipple graft technique so as to get as much symmetry as possible with greatest risk reduction.  Free nipple graft removes the NAC, debrides the ducts off and then places it back on as FTSG.  It relieves the need to keep a viable pedicle and thus allows a much bigger reduction AND the risk of wound issues secondary to the pedicle having a problem.  However, the nipple is grafted back on and is no longer sensate, etc.  The free nipple technique does utilize a wise pattern.  We discussed  This in detail. Risks include hematoma, infection, pain, scar, asysmmetry, nipple necrosis, lack breast feedling for the premenopausal female, contour problems, fat necrosis, wound healing problems, interference with mammogram, size to big or too small, etc. The scar is in the form of an anchor.    She didn't realize that this was an option and was interested in having a surgery  that was easier recovery with also less secondary surgical steps.   Also any revision of the R breast could be challenging d/t her hx of radiation to the chest. At the time of the reduction we could attempt to changing out the R implant to a larger higher profile which may help with symmetry and shape in clothes.     We did discuss implants.   The FDA does have a new black box warning on implants, but not based on  any new data but 30 years of discussion regarding various issues.  We discussed some of this and there is a 14 page consent that the patient would be given. In my practice most of the implants have lasted over 20 years and we have never seen an anaplastic lymphoma.      500-750 SRX and F sizer and implant    L breast free nipple graft technique wise patter breast reduction and R breast revision with implant removal and placement of silicone implant for symmetry. Surgery will be scheduled. Risks include hematoma, infection, pain, scar, recurrence, contour problems, need for further surgery, etc.

## 2020-08-29 ENCOUNTER — Encounter (HOSPITAL_BASED_OUTPATIENT_CLINIC_OR_DEPARTMENT_OTHER): Payer: Self-pay | Admitting: Adult Health

## 2020-08-29 NOTE — Progress Notes (Signed)
New Consult for Medical Oncology:        Demographics:  Date: September 02, 2020   Patient Name: Lynn Tran   Medical Record #: [id number]   DOB: May 06, 1956  Age: 65 year old  Sex: female    Reason for visit: History Of Breast Cancer    Referred by NP Lynn Tran to establish with Survivorship    History of Present Illness;  Lynn Tran is 65 year old female with h/o BRCA1+ ER- right breast cancer at age 27, then recurrence at age 4 s/p mastectomy, s/p TAH/BSO for uterine carcinosarcoma at age 14 and early stage rectal cancer s/p excision at age 89.     Pt previously met with Lynn Hurter, MS, [place] in 07/2018 due to her personal and family h/o cancer. Pt has h/o right breast cancer (ER-) at age 11, s/p lumpectomy and radiation. Recurrence at age 29 led to right mastectomy and chemotherapy (CMF). She was diagnosed with poorly differentiated carcinosarcoma of the uterus at age 45, s/p TAH/BSO and radiation. She had her first colonoscopy in 2011 and was diagnosed with early stage rectal cancer s/p transanal excision at [facility] 06/2018.  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    She was seen by NP Lynn Tran for consult 10/2018.      She met with Dr. Kathrine Tran 06/2019 and NP Lynn Tran 06/2020 to discuss left breast prophylactic mastectomy.     She in now scheduled 10/11/20 with Dr. Juleen Tran for Left breast reduction and right breast implant exchange for improved cosmesis.     She is establishing with Survivorship today.    I introduced my role, as an Engineer, structural, within the [place]. Since diagnosis was over 5 years ago she will be followed in our [facility]. She was informed that I am supported by several medical-oncologists and our practice uses NP/PA's as independent providers in collaboration with the physicians.     Breast and relevant medical history reviewed and updated.    She reports several recent stressors in her life including the death of  her mother in 2022/06/14. Pt was born and raised here but was living in [place] when her mother started to become a higher level of care. She and her SO moved here to take care of her.  Her mother's house has already sold and pt plans on moving back to [facility] in October after the closing.     She [facility] from [facility] regarding triage did not find her high risk with no family h/o pancreatic cancer and baseline MRI was unremarkable.     We discussed prevention medications includincluding tamoxifen, raloxifene. Per records she has a h/o osteoporosis and SERMs may benefit her bones as well as reduce risk or prevent future HR+ non-invasive or invasive breast cancer.     It is unlikely that endocrine therapy would impact her HR+ breast cancer recurrence since her HR- breast cancer was driven by her BRCA1 mutation.   Pt is not interested in taking medication. She plans on continuing with mammogram and breast MRI surveillance.     Risk Factors:  Menarche: 12  G2 P0 (h/o 1 molar pregnancy tx with methotrexate and 1 ectopic pregnancy)  LMP: 65 yo s/p TAH/BSO  OCP/HRT: denies  Smoking: denies  Alcohol: yes  Ashkenazi Jewish heritage: denies  Family h/o cancer: Mother breast cancer 28 and 55 yo, Melanoma 66. M uncle colon cancer in his 26s, P grandmother with breast cancer in  her 71s, P aunt breast cancer in her 72s   Medical decision maker: Lynn Tran, significant other and Lynn Tran, her friend    Current Medications:  albuterol 108 (90 Base) MCG/ACT inhaler, Inhale 2 puffs by mouth every 6 hours as needed for Wheezing or Shortness of Breath.  bacitracin 500 UNIT/GM ointment, APPLY TOPICALLY TO LEFT BREAST TWICE A DAY  Biotin 1 MG CAPS,   budesonide-formoterol (SYMBICORT) 80-4.5 MCG/ACT inhaler, inhale 2 puffs by mouth twice a day  Cholecalciferol (VITAMIN D3) 1000 units tablet, 4,000 Int'l Units.  CINNAMON PO,   clobetasol propionate (TEMOVATE) 0.05 % cream, Apply 1 Application topically 2 times daily. Apply to  affected area twice a day for recurrent contact dermatitis.  Coenzyme Q10 (CO Q 10 PO),   COLLAGEN PO,   Cyanocobalamin 1000 MCG/ML KIT, Through weight clinic  diclofenac (VOLTAREN) 1 % gel, Apply 4 g topically 4 times daily. Use the plastic dose guide found inside the box to measure the dose.  Glucosamine-MSM-Hyaluronic Acd (JOINT HEALTH PO),   ibuprofen (MOTRIN) 800 MG tablet, Take 1 tablet (800 mg) by mouth every 8 hours as needed for Mild Pain (Pain Score 1-3). Refilled require visit.  ipratropium (ATROVENT) 0.03 % nasal spray, Spray 2 sprays into each nostril At bedtime as needed for Rhinitis.  Milk Thistle 250 MG CAPS,   omeprazole (PRILOSEC) 20 MG capsule, Take 1 capsule (20 mg) by mouth daily.      Allergies:  Patient is allergic to adhesive tape, dmdm hydantoin, ancef [cefazolin], codeine, morphine, and [other].    Past Medical History:  Patient  has a past medical history of Asthma, BRCA gene mutation positive in female (2020), Breast cancer (CMS-HCC) (1984), Ectopic pregnancy, Molar pregnancy, Rectal cancer (CMS-HCC) (06/2018), Screening for cervical cancer (09/28/2018), and Uterine cancer (CMS-HCC).    She has no past medical history of Acute deep vein thrombosis of proximal leg (CMS-HCC), Clotting disorder (CMS-HCC), or Heart disease.    Past Surgical History:  The patient's  has a past surgical history that includes Tonsillectomy and Adenoidectomy; H/o ectopic pregnancy; h/o  S/p robotic assisted transanal excision of previous polypectomy site within the distal rectum (05/2018); Total abdominal hysterectomy w/ bilateral salpingoophorectomy (1998); Breast lumpectomy (Right, 1984); and Mastectomy (Right, 1995).    Social History:  The patient's  reports that she has never smoked. She has never used smokeless tobacco. She reports current alcohol use of about 14.0 standard drinks of alcohol per week. She reports that she does not use drugs.   Alcohol Use: Unknown    Frequency of Alcohol Consumption: 4 or  more times a week    Average Number of Drinks: 1 or 2    Frequency of Binge Drinking: Not on file       REVIEW OF SYSTEMS   GEN: The patient has no weight loss, fevers, or chills  EYES:No change in vision.  ENT: No change in hearing. No epistaxis.   PULM: No dyspnea, productive cough, or wheezing  CARDIO:  No chest pain, tachycardias, dyspnea on exertion.  GI: No jaundice, hematemesis, abdominal pain, diarrhea or constipation.  GU: No dysuria or hematuria  ENDO: No diabetes.  JOINT:  No new joint pains.  HEME/LYMPHATIC: No bleeding, anemias, or lymphadenopathy.  NEURO: No loss of consciousness, headaches.    Physical Exam:  BP 127/75 (BP Location: Left arm, BP Patient Position: Sitting, BP cuff size: Regular)    Pulse 91    Temp 98.1 F (36.7 C) (Temporal)  Resp 16    Ht '5\' 9"'  (1.753 m)    Wt 88.2 kg (194 lb 7.1 oz)    SpO2 98%    BMI 28.71 kg/m    ECOG: 0  General Appearance: The patient is an alert, cooperative female in no acute distress.  SKIN: No lesions  HEENT: Sclera anicteric, EOMs are intact.  The oropharynx is clear.   LYMPH NODES: No palpable supra clavicular, cervical, axillary, or inguinal nodes.  CHEST: Breathing comfortably and speaking in complete sentences  BREAST: deferred  HEART: pulse 91, no LE edema  EXTREMITIES: There is no edema or cyanosis. Joints are normal without redness or swelling.  NEURO:  Mental status is normal.  No focal neurologic findings.    Assessment/Plan:  In summary this patient is a 65 year old female with     ICD-10-CM ICD-9-CM   1. History of right breast cancer  Z85.3 V10.3   2. BRCA1 positive  Z15.01 V84.01    Z15.09    3. S/P right mastectomy  Z90.11 V45.71   4. History of rectal cancer  Z85.048 V10.06   5. History of uterine cancer  Z85.42 V10.42   6. S/P TAH-BSO  Z90.710 V88.01    Z90.722     Z90.79    7. BMI 28.0-28.9,adult  Z68.28 V85.24     Pt with h/o ER- right breast cancer in 1984, s/p lumpectomy and radiation, and ER- cancer recurrence in 1995, BRCA1+ s/p  right mastectomy.    She is schedule for large left breast reduction and right implant exchange for symmetry and cosmesis by Dr. Juleen Tran in May 2022. Pt reports insurance is changing May 1st to Medicare. Lynn Tran is aware.     Conitnue imaging with annual left mmg and Breast MRI d/t high risk breast cancer surveillance    Pt informed that her breast cancer is mostly driven by her BRCA1 mutation and not necessarily hormones. There may be a benefit in reducing HR+ breast cancer with endocrine therapy.  Prevention with medication was discussed with the patient.   She is not interested in trying endocrine therapy, which I support for her h/o ER- BRCA1+ breast cancer.    Survivorship guidelines and recommendations reviewed included healthy eating, active living, BMI goal (20-25), limit alcohol and avoid smoking.   Pt plans on resuming increased activity now that she is able.    According to NCCN guidelines: Surveillance testing (labwork, imaging, other studies) should be based on cancer diagnosis and individualized patient risk. A small excess risk of cancer has been linked to frequent radiographic imaging. Surveillance testing should be performed as per disease-specific NCCN guidelines. Additional labwork, imaging or other studies to evaluate for recurrence should be based on clinical presentation and judgment. No labs or imaging ordered. Labs per PCP and imaging per symptoms    She [facility] from [facility] regarding triage did not find her high riske with no family h/o pancreatic cancer and baseline MRI was unremarkable.     Continue with PCP  Pt and notes report h/o osteoporosis, no Dexa available, she tried Boniva/Fosamax for "years" and discontinued due to oral/dental changes.    RTC if needed, pt moving back to [place] in 02/2021. She asks if I could recommend an oncologist or high risk program in [place], [facility] or [place]. 2 web sites sent to pt to consider.     45+ minutes was spent  educating, counseling, reviewing chart, documenting and coordinating care.   >80% of the  visit was spent face to face with patient reviewing diagnosis, prognosis, treatment options, use of medication and management of potential side effects. Barriers to learning assessed: None.   Diagnoses, natural course, treatments and risks were discussed with the patient. Patient verbalizes understanding and is agreeable to above plan.    My Supervising Physician, Dr. Kellie Moor, was available in person or by electronic communication during this new consult.

## 2020-09-02 ENCOUNTER — Encounter (HOSPITAL_BASED_OUTPATIENT_CLINIC_OR_DEPARTMENT_OTHER): Payer: Self-pay | Admitting: Adult Health

## 2020-09-02 ENCOUNTER — Ambulatory Visit: Payer: Medicaid Other | Attending: Nurse Practitioner | Admitting: Adult Health

## 2020-09-02 VITALS — BP 127/75 | HR 91 | Temp 98.1°F | Resp 16 | Ht 69.0 in | Wt 194.4 lb

## 2020-09-02 DIAGNOSIS — Z90710 Acquired absence of both cervix and uterus: Secondary | ICD-10-CM | POA: Insufficient documentation

## 2020-09-02 DIAGNOSIS — Z1501 Genetic susceptibility to malignant neoplasm of breast: Secondary | ICD-10-CM

## 2020-09-02 DIAGNOSIS — Z90722 Acquired absence of ovaries, bilateral: Secondary | ICD-10-CM | POA: Insufficient documentation

## 2020-09-02 DIAGNOSIS — Z6828 Body mass index (BMI) 28.0-28.9, adult: Secondary | ICD-10-CM | POA: Insufficient documentation

## 2020-09-02 DIAGNOSIS — Z8542 Personal history of malignant neoplasm of other parts of uterus: Secondary | ICD-10-CM | POA: Insufficient documentation

## 2020-09-02 DIAGNOSIS — Z85048 Personal history of other malignant neoplasm of rectum, rectosigmoid junction, and anus: Secondary | ICD-10-CM | POA: Insufficient documentation

## 2020-09-02 DIAGNOSIS — Z9079 Acquired absence of other genital organ(s): Secondary | ICD-10-CM

## 2020-09-02 DIAGNOSIS — Z1509 Genetic susceptibility to other malignant neoplasm: Secondary | ICD-10-CM | POA: Insufficient documentation

## 2020-09-02 DIAGNOSIS — Z9011 Acquired absence of right breast and nipple: Secondary | ICD-10-CM | POA: Insufficient documentation

## 2020-09-02 DIAGNOSIS — Z853 Personal history of malignant neoplasm of breast: Secondary | ICD-10-CM

## 2020-09-02 MED ORDER — BACITRACIN 500 UNIT/GM EX OINT
TOPICAL_OINTMENT | CUTANEOUS | Status: DC
Start: 2020-06-21 — End: 2020-10-03

## 2020-09-02 NOTE — Patient Instructions (Addendum)
Danijela Vessey, MSN, NP-C  Oncology Nurse Practitioner     [facility]  [address]  [place], [place]  [address]  Phone: [phone]    Phone Administrative Assistant for appointments [phone]    Fax for patient care [phone]    After Hours for Oncology physician on call: [phone] or [phone]    Financial Counselors:  [phone]    Breast Oncology Social Worker: Laurie Knight, LCSW [phone]

## 2020-09-02 NOTE — Goals of Care (Signed)
Advance Care Planning      GOALS OF CARE / ADVANCE CARE PLANNING CONVERSATION NOTE     Discussed with patient and/or patient surrogate the voluntary nature of the encounter. Explained principles of goals of care with patient and/or patient surrogate   Patient present for discussion     Advance Care Planning       Is there someone you trust to help make medical decisions for you if there ever came a time you could not speak for yourself?  This person is called a Designer, television/film set, Advertising copywriter, proxy, or surrogate,and would be your advocate.     Medical decision maker: Lynn Tran, significant other and Mila Merry, her friend      Have you written the above in an Advance Directive? This is a legal form that lets you write down the name of your healthcare agent and your wishes for medical care.  Some people may also have a a POLST.     Recently discussed with surg-onc team    Have you talked about the above with the person(s) you'd like to be your healthcare agent? If patient does not have someone they trust to be a healthcare agent, they can just focus on Part 2 of the PrepareForYourCare Advance Directive and cross out Part 1.   Yes    Based on above information I recommend the following: discuss your wishes with your healthcare agent and loved ones    Total time spent face to face with patient and/or surrogate in Advance Care Planning: 1 min.

## 2020-09-12 ENCOUNTER — Other Ambulatory Visit (HOSPITAL_BASED_OUTPATIENT_CLINIC_OR_DEPARTMENT_OTHER): Payer: Self-pay | Admitting: Plastic Surgery

## 2020-10-03 ENCOUNTER — Ambulatory Visit (INDEPENDENT_AMBULATORY_CARE_PROVIDER_SITE_OTHER): Payer: Medicaid Other

## 2020-10-03 ENCOUNTER — Encounter (INDEPENDENT_AMBULATORY_CARE_PROVIDER_SITE_OTHER): Payer: Self-pay

## 2020-10-03 DIAGNOSIS — Z01818 Encounter for other preprocedural examination: Secondary | ICD-10-CM

## 2020-10-03 NOTE — Patient Instructions (Signed)
Your surgery is currently scheduled at [facility] on 10/11/20  The scheduler will be contacting you with the check in time                                                                                 FACILITY ADDRESS    [facility], [address] [place], [place] [address]  Check in and Operating room is down the elevator on the lower level (LL)    PARKING    [facility]: [address] structure, [employer] structure, or valet parking (7am-5pm at [facility] entrance and [facility] Entrance; 5am-5pm at [facility] Entrance by Emergency room/Labor and Delivery) for same cost as self-parking.  [email or web address]       QUESTIONS    If you have any questions between now and the day of your surgery, please do not hesitate to call:     [facility]: [phone]       DAY OF SURGERY ARRIVAL TIME:    On the day of your Surgery/Procedure, please arrive at the time provided by the surgery/preop team. [facility] surgery Pre-op team will contact you the day before surgery.      MEDICATION INSTRUCTIONS BEFORE SURGERY/PROCEDURE:    PLEASE HOLD ALL NSAIDS (non-steroidal anti-inflammatory drugs) SUCH AS advil, aleve, motrin, ibuprofen, relafen, lodine, feldene, Diclofenac, voltaren, indomethacin, naproxen, celebrex, Mobic 7 days before surgery (starting on 10/04/20).       Please hold vitamins, supplements, herbs & fish oil 7 days before surgery (starting on 10/04/20).     It is OK to take acetaminophen (Tylenol) for pain around the time of surgery unless you have liver disease.      AFTER YOUR VISIT WITH US, IF YOU START TAKING A NEW MEDICATION BEFORE SURGERY, PLEASE CALL US TO MAKE SURE IT IS SAFE TO TAKE & WILL NOT AFFECT YOUR SURGERY.         OSA INSTRUCTIONS:     If you use a CPAP machine, please bring the entire machine, including mask and tubing, with you on the day of surgery.    TO DO LIST:         EATING/DRINKING     DO  NOT EAT OR DRINK ANYTHING AFTER MIDNIGHT ON THE DAY OF SURGERY    Preparing for your Surgery:     Please wear clean loose-fitting clothes and leave valuables at home   Do not shave or remove body hair. Facial shaving is permitted. If you are having head/face surgery, ask your surgeon's office whether you can shave.  Bring a picture ID and your insurance card, and be prepared to pay your deductible or co-insurance by cash, check, or credit card when you arrive.   All patients [facility] go home after their surgery, Please make sure to arrange for an adult to drive you home. You CANNOT use UBER or LYFT. If you do not have a ride, your surgery may be cancelled.       On The Day of Your Surgery:      Check in at the location mentioned above   COVID testing may be done on arrival to the Pre-op or Procedural areas   or Procedural areas according to the current CDPH mandate.   If you are a woman of child bearing age, please note that you may be asked to give a urine sample upon check-in  You will meet your anesthesia and surgery teams in the preoperative holding area before surgery.   Once surgery is over, you will wake up in the recovery room.  An adult chaperone will need to stay with you for the first 24 hours after surgery.   Visitor policy during the FRTMY-11 pandemic is subject to change. Current visitor policy can be found at [email or web address]      A video about what to expect for the day of surgery can be found here:    [email or web address]  Or by searching You-tube for [place] before surgery and [place] after surgery     You medical records are available to you at [email or web address] click sign up now.

## 2020-10-03 NOTE — Interdisciplinary (Signed)
Anesthesia Preparedness Clinic [facility]) RN PHONE CALL NOTE    Phone call to patient from [facility] RN today.     Confirmed medical history & that medications listed in Epic are accurate and up to date. Reviewed most recent labs in media. Pt reports that she has Right sided lymphedema and right arm is not to be used for IV/BPs. She states that she is a hard stick for IVs. Denies any SOB, chest pain, palpitations, dizziness or syncope.    Patient scheduled for surgery on 10/11/20 at [facility].    Confirmed preoperative instructions with patient including NPO instructions.     Planning your surgery information sent to patient via email: [email or web address]    No questions or concerns at this time.

## 2020-10-04 ENCOUNTER — Other Ambulatory Visit: Payer: Self-pay

## 2020-10-05 NOTE — Telephone Encounter (Addendum)
 Called patient to offer visit with Dr. Lennox Solders as per below. Patient was under the impression that Dr. Lennox Solders moved. Informed her that he did move to [place], but still practices in [place], telemed Mon/Tues and commutes to office on Thurs.     Patient said she just had some blood work, physical exam recently. In no rush to schedule. Asked for doctor at [facility] office, informed of Dr. Maxwell [name] who's there Monday - Wednesday. Patient requested to schedule with her instead due to more availability. Looks like Dr. Maxwell [name] is on vacation for month of July, but will be back in August. Scheduled for 8/3. Informed to come in a little early so we can enter Medicare and [employer] PPO supplement

## 2020-10-10 ENCOUNTER — Encounter (HOSPITAL_BASED_OUTPATIENT_CLINIC_OR_DEPARTMENT_OTHER): Payer: Self-pay | Admitting: Plastic Surgery

## 2020-10-10 NOTE — Anesthesia Preprocedure Evaluation (Addendum)
ANESTHESIA PRE-OPERATIVE EVALUATION    Patient Information    Name: Lynn Tran    MRN: [id number]    DOB: [birth year]    Age: 65 year old    Sex: female  Procedure(s):  Left breast free nipple graft technique wise patter breast reduction  Right breast revision with implant removal and placement of silicone implant for symmetry.      Pre-op Vitals:   There were no vitals taken for this visit.        Primary language spoken:  English    ROS/Medical History:          General:  negative for General ROS   Cardiovascular:  negative cardio ROS     Anesthesia History:  PONV,   Pulmonary:   asthma (Mild),     Neuro/Psych:   negative neuro/psych ROS   Hematology/Oncology:   hematologic/lymphatic negative      GI/Hepatic:  GERD,   Infectious Disease:  negative for infectious disease     Renal:  negative renal ROS   Endocrine/Other:  negative endo/other ROS     Pregnancy History:   Pediatrics:         Pre Anesthesia Testing (PCC/CPC) notes/comments:                 Physical Exam    Airway:    Inter-inciser distance > 4 cm  Prognanth Able    Mallampati: II  Neck ROM: full  TM distance: 4-5 cm            Cardiovascular:  - cardiovascular exam normal   Rhythm: regular   Rate: normal         Pulmonary:  - pulmonary exam normal           Neuro/Neck/Skeletal/Skin:  - Neck/Neuro/Skeletal/Skin exam normal          Dental:  - normal exam    Abdominal:   - normal exam     General: normal weight     Additional Clinical Notes:               Last  OSA (STOP BANG) Score:  No data recorded    Last OSA  (STOP) Score for   Has a physician diagnosed you with sleep apnea?: No  Do you use a CPAP at home?: No  Do you snore loudly (loud enough to be heard through a closed door)?: Yes  Do you often feel tired, fatigued or sleepy during the day?: No  Has anyone observed that you stop breathing while you are sleeping?: No  Have you ever been treated for high blood pressure?: No  OSA total score (A score of 2 or more is high risk. Offer patient sleep  study.): 1                   Past Medical History:   Diagnosis Date    Asthma     BRCA gene mutation positive in female 2020    Breast cancer (CMS-HCC) 1984    right, and recurrence 1995, age 43    Ectopic pregnancy     Lymphedema of right arm     NO IV/BP    Molar pregnancy     tx with metotrexate at age 49 yo     PONV (postoperative nausea and vomiting)     Rectal cancer (CMS-HCC) 06/2018    Screening for cervical cancer 09/28/2018    09/25/2018 - s/p hysterectomy for uterine cancer; defer to GynOnc re: if  pap needed though unlikely. 10/29/18 - message to Evert Kohl re: confirm no longer needing paps with h/o squamous metaplasia on cervical pathology 10/31/18 - no longer needs paps confirmed.    Uterine cancer (CMS-HCC)     carcinosarcoma     Past Surgical History:   Procedure Laterality Date    h/o  S/p robotic assisted transanal excision of previous polypectomy site within the distal rectum  05/2018    TOTAL ABDOMINAL HYSTERECTOMY W/ BILATERAL SALPINGOOPHORECTOMY  1998    MASTECTOMY Right 1995    breast cancer recurrence    BREAST LUMPECTOMY Right 1984    H/o ectopic pregnancy      TONSILLECTOMY AND ADENOIDECTOMY       Social History     Socioeconomic History    Marital status: Significant Other   Tobacco Use    Smoking status: Never Smoker    Smokeless tobacco: Never Used   Substance and Sexual Activity    Alcohol use: Yes     Alcohol/week: 14.0 standard drinks     Types: 14 Standard drinks or equivalent per week    Drug use: No    Sexual activity: Yes     Partners: Male   Social History Narrative    Pt born and raised in [place]. She was living in [place] when her mother's health started to deteriorate. She moved back to [facility] to take care of her mother, who passed 05/2020. Pt will move back to [facility] 02/2021     Alcohol Use: Not on file       No current facility-administered medications for this encounter.     Current Outpatient Medications   Medication Sig Dispense Refill     albuterol 108 (90 Base) MCG/ACT inhaler Inhale 2 puffs by mouth every 6 hours as needed for Wheezing or Shortness of Breath. 8.5 g 11    Biotin 1 MG CAPS       budesonide-formoterol (SYMBICORT) 80-4.5 MCG/ACT inhaler inhale 2 puffs by mouth twice a day 30.6 g 3    Cholecalciferol (VITAMIN D3) 1000 units tablet 4,000 Int'l Units.      CINNAMON PO       clobetasol propionate (TEMOVATE) 0.05 % cream Apply 1 Application topically 2 times daily. Apply to affected area twice a day for recurrent contact dermatitis. 30 g 1    Coenzyme Q10 (CO Q 10 PO)       COLLAGEN PO       Cyanocobalamin 1000 MCG/ML KIT Through weight clinic      diclofenac (VOLTAREN) 1 % gel Apply 4 g topically 4 times daily. Use the plastic dose guide found inside the box to measure the dose. 1 Tube 1    Glucosamine-MSM-Hyaluronic Acd (JOINT HEALTH PO)       ibuprofen (MOTRIN) 800 MG tablet Take 1 tablet (800 mg) by mouth every 8 hours as needed for Mild Pain (Pain Score 1-3). Refilled require visit. 30 tablet 0    ipratropium (ATROVENT) 0.03 % nasal spray Spray 2 sprays into each nostril At bedtime as needed for Rhinitis. 1 bottle 11    Milk Thistle 250 MG CAPS       omeprazole (PRILOSEC) 20 MG capsule Take 1 capsule (20 mg) by mouth daily. 90 capsule 3     Allergies   Allergen Reactions    Adhesive Tape Rash    Dmdm Hydantoin Hives    Neosporin [Bacitracin-Polymyxin B] Rash    Ancef [Cefazolin] Other     Delayed urticaria;  significant uncertainly as may have been due to concurrently administered opioid    Codeine Nausea and Vomiting    Morphine Nausea and Vomiting    [Other] Other     Positive patch testing to:  Mercapto mix  quaternium 15  Imidazolidinyl urea  Neomycin  parthenolide  formaldehyde       Labs and Other Data  Lab Results   Component Value Date    NA 144 11/19/2019    K 4.7 11/19/2019    CL 108 (H) 11/19/2019    BICARB 21 11/19/2019    BUN 16 11/19/2019    CREAT 0.88 11/19/2019    GLU 88 11/19/2019    CA 9.5  11/19/2019     Lab Results   Component Value Date    AST 28 11/19/2019    ALT 27 11/19/2019    GGT 21 11/19/2019    ALK 73 11/19/2019    TP 6.5 11/19/2019    ALB 4.4 11/19/2019    TBILI 0.5 11/19/2019     Lab Results   Component Value Date    WBC 4.0 11/19/2019    RBC 4.16 11/19/2019    HGB 13.5 11/19/2019    HCT 39.3 11/19/2019    MCV 95 11/19/2019    MCHC 34.4 11/19/2019    RDW 13.1 11/19/2019    PLT 298 11/19/2019    SEG 44 11/19/2019    LYMPHS 40 11/19/2019    MONOS 12 11/19/2019    EOS 3 11/19/2019    BASOS 1 11/19/2019     No results found for: INR, PTT  No results found for: ARTPH, ARTPO2, ARTPCO2    Anesthesia Plan:  Risks and Benefits of Anesthesia  I have personally performed an appropriate pre-anesthesia physical exam of the patient (including heart, lungs, and airway) prior to the anesthetic and reviewed the pertinent medical history, drug and allergy history, laboratory and imaging studies and consultations.   I have determined that the patient has had adequate assessment and testing.  I have validated the documentation of these elements of the patient exam and/or have made necessary changes to reflect my own observations during my pre-anesthesia exam.  Anesthetic techniques, invasive monitors, anesthetic drugs for induction, maintenance and post-operative analgesia, risks and alternatives have been explained to the patient and/or patient's representatives.    I have prescribed the anesthetic plan:         Planned anesthesia method: General         ASA 3 (Severe systemic disease)     Potential anesthesia problems identified and risks including but not limited to the following were discussed with patient and/or patient's representative: Adverse or allergic drug reaction, Administration of blood products, Recall, Ocular injury, Dental injury or sore throat, Nerve injury, Injury to brain, heart and other organs, Death and Patient declined further  discussion of risks of anesthesia    No Beta Blocker  Indicated: Patient not on beta blockers    Planned monitoring method: Routine monitoring    Informed Consent:  Anesthetic plan and risks discussed with Patient.    Plan discussed with OR Nurse and Surgeon.

## 2020-10-11 ENCOUNTER — Ambulatory Visit (HOSPITAL_BASED_OUTPATIENT_CLINIC_OR_DEPARTMENT_OTHER): Payer: Medicare Other | Admitting: Anesthesiology

## 2020-10-11 ENCOUNTER — Encounter (HOSPITAL_COMMUNITY): Payer: Self-pay

## 2020-10-11 ENCOUNTER — Encounter (HOSPITAL_BASED_OUTPATIENT_CLINIC_OR_DEPARTMENT_OTHER): Admission: RE | Disposition: A | Discharge status: Home Health | Payer: Self-pay | Attending: Plastic Surgery

## 2020-10-11 ENCOUNTER — Ambulatory Visit (HOSPITAL_BASED_OUTPATIENT_CLINIC_OR_DEPARTMENT_OTHER): Payer: Medicare Other | Admitting: Student in an Organized Health Care Education/Training Program

## 2020-10-11 ENCOUNTER — Ambulatory Visit
Admission: RE | Admit: 2020-10-11 | Discharge: 2020-10-11 | Disposition: A | Discharge status: Home / Self Care | Payer: Medicare Other | Attending: Plastic Surgery | Admitting: Plastic Surgery

## 2020-10-11 DIAGNOSIS — N62 Hypertrophy of breast: Secondary | ICD-10-CM | POA: Insufficient documentation

## 2020-10-11 DIAGNOSIS — N61 Mastitis without abscess: Secondary | ICD-10-CM

## 2020-10-11 DIAGNOSIS — Z853 Personal history of malignant neoplasm of breast: Secondary | ICD-10-CM

## 2020-10-11 DIAGNOSIS — Z9011 Acquired absence of right breast and nipple: Secondary | ICD-10-CM | POA: Insufficient documentation

## 2020-10-11 DIAGNOSIS — N651 Disproportion of reconstructed breast: Secondary | ICD-10-CM

## 2020-10-11 DIAGNOSIS — Z1501 Genetic susceptibility to malignant neoplasm of breast: Secondary | ICD-10-CM

## 2020-10-11 DIAGNOSIS — G8918 Other acute postprocedural pain: Secondary | ICD-10-CM

## 2020-10-11 DIAGNOSIS — M6028 Foreign body granuloma of soft tissue, not elsewhere classified, other site: Secondary | ICD-10-CM

## 2020-10-11 DIAGNOSIS — T8542XA Displacement of breast prosthesis and implant, initial encounter: Secondary | ICD-10-CM

## 2020-10-11 DIAGNOSIS — Z8542 Personal history of malignant neoplasm of other parts of uterus: Secondary | ICD-10-CM

## 2020-10-11 DIAGNOSIS — Z85048 Personal history of other malignant neoplasm of rectum, rectosigmoid junction, and anus: Secondary | ICD-10-CM

## 2020-10-11 SURGERY — MAMMOPLASTY, REDUCTION
Anesthesia: General | Site: Chest | Laterality: Right | Wound class: Class I (Clean)

## 2020-10-11 MED ORDER — FENTANYL CITRATE (PF) 250 MCG/5ML IJ SOLN
INTRAMUSCULAR | Status: DC | PRN
Start: 2020-10-11 — End: 2020-10-11
  Administered 2020-10-11 (×3): 100 ug via INTRAVENOUS

## 2020-10-11 MED ORDER — ONDANSETRON HCL 4 MG OR TABS
4.0000 mg | ORAL_TABLET | Freq: Three times a day (TID) | ORAL | 0 refills | Status: DC | PRN
Start: 2020-10-11 — End: 2021-08-30

## 2020-10-11 MED ORDER — MENTHOL 3 MG MT LOZG
1.0000 | LOZENGE | OROMUCOSAL | Status: DC | PRN
Start: 2020-10-11 — End: 2020-10-11

## 2020-10-11 MED ORDER — MIDAZOLAM HCL 2 MG/2ML IJ SOLN
INTRAMUSCULAR | Status: DC | PRN
Start: 2020-10-11 — End: 2020-10-11
  Administered 2020-10-11: 2 mg via INTRAVENOUS

## 2020-10-11 MED ORDER — SULFAMETHOXAZOLE-TRIMETHOPRIM 800-160 MG OR TABS
1.0000 | ORAL_TABLET | Freq: Two times a day (BID) | ORAL | 0 refills | Status: DC
Start: 2020-10-11 — End: 2021-01-18

## 2020-10-11 MED ORDER — AMISULPRIDE (ANTIEMETIC) 10 MG/4ML IV SOLN
10.0000 mg | Freq: Once | INTRAVENOUS | Status: DC | PRN
Start: 2020-10-11 — End: 2020-10-11

## 2020-10-11 MED ORDER — SUGAMMADEX SODIUM 200 MG/2ML IV SOLN
INTRAVENOUS | Status: DC | PRN
Start: 2020-10-11 — End: 2020-10-11
  Administered 2020-10-11 (×2): 180 mg via INTRAVENOUS

## 2020-10-11 MED ORDER — TRANEXAMIC ACID 1000 MG/10ML IV SOLN
INTRAVENOUS | Status: DC | PRN
Start: 2020-10-11 — End: 2020-10-11
  Administered 2020-10-11 (×2): 1000 mg via INTRA_ARTICULAR

## 2020-10-11 MED ORDER — LIDOCAINE HCL 2 % IJ SOLN WRAPPED RECORD
INTRAMUSCULAR | Status: DC | PRN
Start: 2020-10-11 — End: 2020-10-11
  Administered 2020-10-11 (×2): 30 mg via INTRAVENOUS

## 2020-10-11 MED ORDER — FENTANYL CITRATE (PF) 50 MCG/ML IJ SOLN (WRAPPED RECORD) ~~LOC~~
50.0000 ug | INTRAMUSCULAR | Status: DC | PRN
Start: 2020-10-11 — End: 2020-10-11
  Administered 2020-10-11 (×2): 50 ug via INTRAVENOUS
  Filled 2020-10-11: qty 1

## 2020-10-11 MED ORDER — PROPOFOL 200 MG/20ML IV EMUL
INTRAVENOUS | Status: DC | PRN
Start: 2020-10-11 — End: 2020-10-11
  Administered 2020-10-11: 20 mg via INTRAVENOUS

## 2020-10-11 MED ORDER — CLINDAMYCIN PHOSPHATE 600 MG/4ML IJ SOLN
INTRAMUSCULAR | Status: AC
Start: 2020-10-11 — End: ?
  Filled 2020-10-11: qty 4

## 2020-10-11 MED ORDER — ROPIVACAINE HCL 5 MG/ML IJ SOLN
INTRAMUSCULAR | Status: DC | PRN
Start: 2020-10-11 — End: 2020-10-11
  Administered 2020-10-11 (×2): 10 mL via PERINEURAL

## 2020-10-11 MED ORDER — DOCUSATE SODIUM 250 MG OR CAPS
250.0000 mg | ORAL_CAPSULE | Freq: Two times a day (BID) | ORAL | 0 refills | Status: DC
Start: 2020-10-11 — End: 2021-09-11

## 2020-10-11 MED ORDER — CIPROFLOXACIN IN D5W 400 MG/200ML IV SOLN
INTRAVENOUS | Status: AC
Start: 2020-10-11 — End: ?
  Filled 2020-10-11: qty 200

## 2020-10-11 MED ORDER — ROCURONIUM BROMIDE 100 MG/10ML IV SOLN
INTRAVENOUS | Status: DC | PRN
Start: 2020-10-11 — End: 2020-10-11
  Administered 2020-10-11: 10 mg via INTRAVENOUS
  Administered 2020-10-11: 50 mg via INTRAVENOUS
  Administered 2020-10-11: 20 mg via INTRAVENOUS

## 2020-10-11 MED ORDER — ISOSULFAN BLUE 1 % SC SOLN
SUBCUTANEOUS | Status: AC
Start: 2020-10-11 — End: ?
  Filled 2020-10-11: qty 5

## 2020-10-11 MED ORDER — OXYCODONE HCL 5 MG OR TABS
5.0000 mg | ORAL_TABLET | ORAL | Status: DC | PRN
Start: 2020-10-11 — End: 2020-10-11
  Administered 2020-10-11: 5 mg via ORAL
  Filled 2020-10-11: qty 1

## 2020-10-11 MED ORDER — FAMOTIDINE (PF) 20 MG/2ML IV SOLN
INTRAVENOUS | Status: DC | PRN
Start: 2020-10-11 — End: 2020-10-11
  Administered 2020-10-11: 20 mg via INTRAVENOUS

## 2020-10-11 MED ORDER — NALOXONE HCL 0.4 MG/ML IJ SOLN
0.1000 mg | INTRAMUSCULAR | Status: DC | PRN
Start: 2020-10-11 — End: 2020-10-11

## 2020-10-11 MED ORDER — METHYLENE BLUE (ANTIDOTE) 50 MG/10ML IV SOLN
INTRAVENOUS | Status: DC | PRN
Start: 2020-10-11 — End: 2020-10-11
  Administered 2020-10-11 (×2): 10 mL via TOPICAL

## 2020-10-11 MED ORDER — PROPOFOL 1000 MG/100ML IV EMUL
INTRAVENOUS | Status: DC | PRN
Start: 2020-10-11 — End: 2020-10-11
  Administered 2020-10-11: 100 ug/kg/min via INTRAVENOUS
  Administered 2020-10-11 (×2): 200 ug/kg/min via INTRAVENOUS
  Administered 2020-10-11: 100 ug/kg/min via INTRAVENOUS
  Administered 2020-10-11: 200 ug/kg/min via INTRAVENOUS
  Administered 2020-10-11: 150 ug/kg/min via INTRAVENOUS
  Administered 2020-10-11: 50 ug/kg/min via INTRAVENOUS
  Administered 2020-10-11: 100 ug/kg/min via INTRAVENOUS

## 2020-10-11 MED ORDER — ONDANSETRON HCL 4 MG/2ML IV SOLN
INTRAMUSCULAR | Status: DC | PRN
Start: 2020-10-11 — End: 2020-10-11
  Administered 2020-10-11: 4 mg via INTRAVENOUS

## 2020-10-11 MED ORDER — LACTATED RINGERS IV SOLN
INTRAVENOUS | Status: DC
Start: 2020-10-11 — End: 2020-10-11

## 2020-10-11 MED ORDER — SUGAMMADEX SODIUM 200 MG/2ML IV SOLN
INTRAVENOUS | Status: AC
Start: 2020-10-11 — End: ?
  Filled 2020-10-11: qty 2

## 2020-10-11 MED ORDER — METHYLENE BLUE (ANTIDOTE) 50 MG/10ML IV SOLN
INTRAVENOUS | Status: AC
Start: 2020-10-11 — End: ?
  Filled 2020-10-11: qty 10

## 2020-10-11 MED ORDER — METOCLOPRAMIDE HCL 5 MG/ML IJ SOLN
INTRAMUSCULAR | Status: DC | PRN
Start: 2020-10-11 — End: 2020-10-11
  Administered 2020-10-11 (×2): 5 mg via INTRAVENOUS

## 2020-10-11 MED ORDER — ACETAMINOPHEN 325 MG PO TABS
975.0000 mg | ORAL_TABLET | Freq: Once | ORAL | Status: DC
Start: 2020-10-11 — End: 2020-10-11
  Filled 2020-10-11: qty 3

## 2020-10-11 MED ORDER — LIDOCAINE HCL (PF) 1 % IJ SOLN
0.1000 mL | Freq: Once | INTRAMUSCULAR | Status: DC | PRN
Start: 2020-10-11 — End: 2020-10-11

## 2020-10-11 MED ORDER — CIPROFLOXACIN IN D5W 400 MG/200ML IV SOLN
INTRAVENOUS | Status: DC | PRN
Start: 2020-10-11 — End: 2020-10-11
  Administered 2020-10-11 (×2): 400 mg via INTRAVENOUS

## 2020-10-11 MED ORDER — DIPHENHYDRAMINE HCL 50 MG/ML IJ SOLN
12.5000 mg | Freq: Once | INTRAMUSCULAR | Status: DC | PRN
Start: 2020-10-11 — End: 2020-10-11

## 2020-10-11 MED ORDER — EPINEPHRINE HCL 1 MG/ML IJ SOLN (CUSTOM)
INTRAMUSCULAR | Status: AC
Start: 2020-10-11 — End: ?
  Filled 2020-10-11: qty 1

## 2020-10-11 MED ORDER — FENTANYL CITRATE (PF) 100 MCG/2ML IJ SOLN
INTRAMUSCULAR | Status: AC
Start: 2020-10-11 — End: ?
  Filled 2020-10-11: qty 2

## 2020-10-11 MED ORDER — LIDOCAINE-EPINEPHRINE 1 %-1:100000 IJ SOLN
INTRAMUSCULAR | Status: AC
Start: 2020-10-11 — End: ?
  Filled 2020-10-11: qty 30

## 2020-10-11 MED ORDER — LACTATED RINGERS IV SOLN
INTRAVENOUS | Status: DC | PRN
Start: 2020-10-11 — End: 2020-10-11

## 2020-10-11 MED ORDER — MIDAZOLAM HCL 2 MG/2ML IJ SOLN
INTRAMUSCULAR | Status: AC
Start: 2020-10-11 — End: ?
  Filled 2020-10-11: qty 2

## 2020-10-11 MED ORDER — HYDROCODONE-ACETAMINOPHEN 5-325 MG OR TABS
1.0000 | ORAL_TABLET | Freq: Four times a day (QID) | ORAL | 0 refills | Status: DC | PRN
Start: 2020-10-11 — End: 2021-08-30

## 2020-10-11 MED ORDER — HYDROMORPHONE HCL 1 MG/ML IJ SOLN
0.5000 mg | INTRAMUSCULAR | Status: DC | PRN
Start: 2020-10-11 — End: 2020-10-11

## 2020-10-11 MED ORDER — ONDANSETRON HCL 4 MG/2ML IV SOLN
4.0000 mg | Freq: Once | INTRAMUSCULAR | Status: DC | PRN
Start: 2020-10-11 — End: 2020-10-11
  Administered 2020-10-11: 4 mg via INTRAVENOUS
  Filled 2020-10-11: qty 2

## 2020-10-11 MED ORDER — TRANEXAMIC ACID 1000 MG/10ML IV SOLN
INTRAVENOUS | Status: AC
Start: 2020-10-11 — End: ?
  Filled 2020-10-11: qty 10

## 2020-10-11 MED ORDER — DEXAMETHASONE SODIUM PHOSPHATE 4 MG/ML IJ SOLN (CUSTOM)
INTRAMUSCULAR | Status: DC | PRN
Start: 2020-10-11 — End: 2020-10-11
  Administered 2020-10-11: 4 mg via INTRAVENOUS

## 2020-10-11 SURGICAL SUPPLY — 89 items
APPLICATOR CTN TIP N/STRL6 (Kits/Sets/Trays) ×3
APPLIER CLIP MEDIUM LIGACLIP (Staplers and staple reloads) IMPLANT
APPLIER LIGACLIP MCA W/30 MEDIUM TI CLIPS (Staplers and staple reloads) IMPLANT
BALL COTTON LARGE STERILE 5/PK (Dressings/packing)
BANDAGE COBAN 4 STERILE LATEX FREE (Dressings/packing) ×3 IMPLANT
BANDAGE, GAUZE ROLL, KERLIX 4.5" X 4.1YD (Dressings/packing) ×9 IMPLANT
BLADE SURGEON #10 STERILE (Knives/Blades) ×12 IMPLANT
BREAST IMPLANT  INSPIRA SRX 615CC (Tissue Expanders/breast implants) ×3 IMPLANT
CAUTERY BOVIE ROCKER SWITCH PENCIL (Cautery) ×3
CAUTERY TIP EDGE BLADE ELECTRODE 2.5", HEX LOCKING (Cautery) ×3
CAUTERY TIP EDGE BLADE ELECTRODE 2.75", INSULATED (Cautery) ×3 IMPLANT
CONTAINER PRECISION SPECIMEN, 4OZ- STERILE (Misc Medical Supply) ×15 IMPLANT
CONTAINER SPECIMEN 83 OZ (Misc Medical Supply) ×3
COVER LIGHT HANDLE RIGID (2 PACK) (Misc Surgical Supply) ×3 IMPLANT
DRAIN RELIAVAC FLAT 10MM X 20CM 0070440 (Lines/Drains) ×6 IMPLANT
DRAIN SUCTION EVAC RELIAVAC 100CC (Lines/Drains) ×6 IMPLANT
DRAPE 3/4 W/REINFORCEMENT 53" X 77" (Drapes/towels) ×3 IMPLANT
DRAPE LITHOTOMY LEGGING W/CUFF 31" X 48" (Drapes/towels) ×3 IMPLANT
DRAPE ORTHO U 76" X 120" (Drapes/towels) ×6 IMPLANT
DRAPE POUCH STERI INST 7X11 (Drapes/towels) ×3
DRAPE POUCH STERI INST 7X11 1018 (Drapes/towels) ×2 IMPLANT
DRAPE SURGICAL MAGNETIC 10" X 16" (Drapes/towels) ×3 IMPLANT
DRESSING ABD PAD 7.5" X 8", STERILE (Dressings/packing) ×18 IMPLANT
DRESSING ADAPTIC 3X8 STERILE (Dressings/packing) ×12
DRESSING BIOPATCH 3/4" (Dressings/packing) ×3 IMPLANT
DRESSING BIOPATCH OD 1" ID 4MM (Dressings/packing) ×6
DRESSING SPONGE CURITY 4X4 16 PLY (Dressings/packing) ×9 IMPLANT
DRESSING TEGADERM HP 2.375" X 2.375" (Dressings/packing) ×3 IMPLANT
DRESSING TELFA 3" X 4" STRL (Dressings/packing) ×3 IMPLANT
DRESSING XEROFORM OCCLUSIVE GAUZE ROLL 4" X 9' (SUB) (Dressings/packing) ×6 IMPLANT
DRESSING XEROFORM OCCLUSIVE GAUZE STRIP OVERWRAP 5" X 9" (Dressings/packing) ×9 IMPLANT
FUNNEL KELLER 2 (Procedure Packs/kits) ×2
FUNNEL KELLER 2 BX/5 (Procedure Packs/kits) ×1
GLOVE BIOGEL INDICATOR UNDERGLOVE SIZE 8 (Gloves/Gowns) ×6 IMPLANT
GLOVE BIOGEL PI INDICATOR SIZE 6.5 (Gloves/Gowns) ×9 IMPLANT
GLOVE BIOGEL PI ULTRATOUCH SIZE 6.5 (Gloves/Gowns) ×3 IMPLANT
GLOVE BIOGEL PI ULTRATOUCH SIZE 7.5 (Gloves/Gowns) ×3
GLOVE SURGEON BIOGEL SIZE 7.5 (Gloves/Gowns) ×3
GLOVE SURGICAL BIOGEL SIZE 8 (Gloves/Gowns) ×6 IMPLANT
GOWN SIRUS SM BLUE, AAMI LVL 3 (Gloves/Gowns) ×3 IMPLANT
GOWN STANDARD ULTRA LG BLUE (Gloves/Gowns) ×3 IMPLANT
GOWN SURGICAL ULTRA XL BLUE, AAMI LVL 3 (Gloves/Gowns) ×9 IMPLANT
KIT FILL BREAST EXPANDER (Kits/Sets/Trays) IMPLANT
MARKER DEVON UTILITY WITH LABELS- STERILE (Misc Medical Supply) ×3 IMPLANT
NEEDLE PROTECT 22G X 1.5" (Needles/punch/cannula/biopsy) ×3 IMPLANT
PAD GROUND VALLEYLAB REM ADULT E7507 (Cautery) ×3 IMPLANT
PIN SAFETY 2 STERILE (Patient Care Supply) ×6 IMPLANT
PREP TRAY WET SKIN SCRUB KENDALL (Prep Solutions) ×3 IMPLANT
PROTECTOR ULNAR NERVE PAD, YELLOW (Patient Care Supply) ×6 IMPLANT
SET INTERPULSE HANDPIECE W/ HF TIP AND SUCTION (Tubing/Suction) ×3 IMPLANT
SET NEEDLE INFUSION 21GA (Needles/punch/cannula/biopsy)
SIZER INSPIRA EXTRA-FULL PROFILE 580CC (Sizers) ×3 IMPLANT
SIZER NATRELLE INSPIRA SMOOTH ROUND EXTRA FULL PROFILE 615CC 1/EA (Sizers) ×3 IMPLANT
SLEEVE SCD KNEE MEDIUM (Patient Care Supply) ×3 IMPLANT
SOLUTION IRR POUR BTL .9% N/S 1000 (Non-Pharmacy Meds/Solutions) ×6
SOLUTION IRR POUR BTL 0.9% NS 1000ML (Non-Pharmacy Meds/Solutions) ×6 IMPLANT
SOLUTION IRR POUR BTL H20 1000ML (Non-Pharmacy Meds/Solutions) ×3 IMPLANT
SOLUTION IV 0.9% NS 1000ML (Non-Pharmacy Meds/Solutions) ×3 IMPLANT
SOLUTION POVIDINE IODINE POUCH 10% 3/4 OZ STERILE (Prep Solutions) ×12 IMPLANT
SPONGE LAP RF DETECT 18" X 18" XRAY STERILE (Sponges) ×6 IMPLANT
STAPLER PROXIMATE SKIN 35 WIDE (Staplers and staple reloads) ×3 IMPLANT
STRIP MEDI-STRIP SKIN CLOSURE 1/2 X 4" (Dressings/packing) ×4 IMPLANT
STRIP SKIN CLOSURE 1/2 X 4 (Dressings/packing) ×6
STRIP WOUND CLOSURE 1" X 5" (Dressings/packing) ×3 IMPLANT
SUPPORT MAMMARY LG (External orthotic collars/braces/supports) IMPLANT
SUPPORT MAMMARY MEDIUM (External orthotic collars/braces/supports) ×3 IMPLANT
SUPPORT MAMMARY XL (External orthotic collars/braces/supports)
SUPPORT MAMMARY XXL (External orthotic collars/braces/supports) ×3 IMPLANT
SURGICAL PACK SAME DAY SET-UP (Procedure Packs/kits) ×6 IMPLANT
SUTURE ETHILON 2-0 18" FS (Suture) ×15 IMPLANT
SUTURE ETHILON 3-0 18" FS-1 (Suture) ×12 IMPLANT
SUTURE ETHILON 3-0 18" PS-2 (Suture) ×9 IMPLANT
SUTURE ETHILON 4-0 18" PS-2 (Suture) ×3
SUTURE ETHILON 4-0 18" PS-2 1667G (Suture) ×2
SUTURE MONOCRYL 4-0 18" PS-2 (Y496) (Suture) ×6 IMPLANT
SUTURE MONOCRYL 4-0 27" PS-2 (Y426) (Suture) ×6
SUTURE MONOCRYL PLUS 3-0 27" PS-2 (MCP427) (Suture) ×18 IMPLANT
SUTURE MONOCRYL PLUS 4-0 27" PS-2 MCP426 (Suture) ×9 IMPLANT
SUTURE PERMA-HAND SILK 2-0 18" FS 685H (Suture) ×3 IMPLANT
SUTURE PLAIN GUT 5-0 18" PC-1 1915, FAST ABSORB (Suture) ×3 IMPLANT
SUTURE VICRYL PLUS 2-0 27" LF CP-2 (Suture)
SUTURE VICRYL PLUS 3-0 27" PS-2 VCP427H (Suture) ×3 IMPLANT
SWAB COTTON TIP STERILE 6 (Misc Medical Supply) ×3 IMPLANT
SYRINGE HYPO LL 20CC (Needles/punch/cannula/biopsy)
SYRINGE IRRG 50CC BULB (Misc Medical Supply) ×3 IMPLANT
TOWELS OR BLUE 4-PACK STERILE, DISPOSABLE (Drapes/towels) ×6
TUBING SUCTION VASER (Tubing/Suction) ×3
TUBING VENTX IRRIGATION VASER (Tubing/Suction) ×3 IMPLANT
YANKAUER BULB TIP ON/OFF CONTROL STERILE (Tubing/Suction) ×3

## 2020-10-11 NOTE — Anesthesia Postprocedure Evaluation (Signed)
Anesthesia Post Note    Patient: [name]    Procedure(s) Performed: Procedure(s):  Left breast free nipple graft technique wise pattern breast reduction   ricomplex right breast revision with capsulectomy, readadvancement of pectoralis muscle, implant removal and placement of silicone implant for symmetry, and lipo of axillary tail      Final anesthesia type: General    Patient location: PACU    Post anesthesia pain: adequate analgesia    Mental status: awake, alert  and oriented    Airway Patent: Yes    Last Vitals:   Vitals Value Taken Time   BP 150/72 10/11/20 1430   Temp 36.3 C 10/11/20 1430   Pulse 79 10/11/20 1442   Resp 17 10/11/20 1442   SpO2 97 % 10/11/20 1442   Vitals shown include unvalidated device data.     Post vital signs: stable    Hydration: adequate    N/V:no    Anesthetic complications: no    Plan of care per primary team.

## 2020-10-11 NOTE — Plan of Care (Signed)
Problem: Promotion of Perioperative Health and Safety  Goal: Promotion of Health and Safety of the Perioperative Patient  Description: The patient remains safe, receives treatment appropriate to the surgical intervention and patient's physiological needs and is discharged or transferred to the appropriate level of care.    Information below is the current care plan.  Flowsheets (Taken 10/11/2020 1231)  Patient /Family stated Goal: successful surgery  Guidelines: PACU  Individualized Interventions/Recommendations #1: repositioned for comfort  Individualized Interventions/Recommendations #2 (if applicable): pain management

## 2020-10-11 NOTE — Brief Op Note (Signed)
BRIEF OPERATIVE NOTE     DATE: 10/11/2020  TIME: 1:03 PM    PREOPERATIVE DIAGNOSIS: hx right breast cancer, breast asymmetry    POSTOPERATIVE DIAGNOSIS: same    PROCEDURE, WOUND CLASSIFICATION & WOUND CLOSURE:   Procedure(s):  Left breast free nipple graft technique wise pattern breast reduction - Wound Class: Class I (Clean) - Incision Closure: Deep and Superficial Layers   ricomplex right breast revision with capsulectomy, readadvancement of pectoralis muscle, implant removal and placement of silicone implant for symmetry, and lipo of axillary tail - Wound Class: Class I (Clean) - Incision Closure: Deep and Superficial Layers    SURGEONS:  Primary: Cyndee Brightly, MD  Resident - Assisting: Herbert Spires, Theressa Stamps, MD  Physician Assistant: Julianne Handler, [place]    ANESTHESIA:   Anesthesiologist: [place], Drema Dallas, MD     OR STAFF  Circulator: Dizon, Cheryle Horsfall, RN; Lindi Adie, RN  Scrub: Claudette Stapler    FINDINGS: Right implant exchanged, left nipple free graft    SPECIMENS:  ID Type Source Tests Collected by Time Destination   A : Right Breast Mastectomy Skin Tissue Breast, Right PATHOLOGY TISSUE EXAM Cyndee Brightly, MD 10/11/2020 1018    B : Right Breast Near Total Capsulectomy for Regular Pathology and Anaplastic Lymphoma Tissue Breast, Right PATHOLOGY TISSUE EXAM Cyndee Brightly, MD 10/11/2020 1019    C : Removed Right Breast Implant Foreign Body Breast, Right PATHOLOGY TISSUE EXAM Cyndee Brightly, MD 10/11/2020 1021    D : Left Breast Reduction Skin Tissue Breast, Left PATHOLOGY TISSUE EXAM Cyndee Brightly, MD 10/11/2020 1056    E : Left Breast Medial Wedge Tissue Breast, Left PATHOLOGY TISSUE EXAM Cyndee Brightly, MD 10/11/2020 1104    F : Left Breast Inferior Medial Wedge Tissue Breast, Left PATHOLOGY TISSUE EXAM Cyndee Brightly, MD 10/11/2020 1106    G : Left Breast Superior Medial Wedge Tissue Breast, Left PATHOLOGY  TISSUE EXAM Cyndee Brightly, MD 10/11/2020 1106    H : Left Breast Superior Wedge Tissue Breast, Left PATHOLOGY TISSUE EXAM Cyndee Brightly, MD 10/11/2020 1109    I : Left Breast Lateral Wedge Tissue Breast, Left PATHOLOGY TISSUE EXAM Cyndee Brightly, MD 10/11/2020 1109        IMPLANTS:   Implant Name Type Inv. Item Serial No. Manufacturer Lot No. LRB No. Used Action   BREAST IMPLANT  INSPIRA SRX 615CC - [id number] Tissue Expanders/breast implants BREAST IMPLANT  INSPIRA SRX 615CC [id number] [place] [id number] Right 1 Implanted       Fluids/Blood Products:    IV Fluids: 400cc    Blood Products: none    EBL: 50cc    Urine Output: none    COMPLICATIONS: none    DISPOSITION: stable, to PACU then d/c home       Doyce Loose, [place] 10/11/2020   1:03 PM  pager [phone]          Event Time In   In [place]   Pre Procedure Start 0728   PreOp Nurse Complete 0815   Pre Procedure Tasks Complete 0936   Room Setup Start 0845   Room Ready for Patient 0900   In Room 0942   Incision 1013   Closing Started 1145   Closing Complete 1239   Out of Room [place]   In PACU 1247   PACU Criteria Complete  In Recovery    Recovery Criteria Complete    Ready For Visitors    Anesthesia Start [phone]   Anesthesia Ready [phone]   Anesthesia Stop 1249   Epidural to C-Section    Regional Anesthesia Start    Regional Anesthesia Stop    Regional Block Administered

## 2020-10-11 NOTE — H&P (Addendum)
Medical Record #: [id number]   DOB: 02/09/1956    Reason for Visit: Planned surgical procedure    History of Present Illness:     Lynn Tran is a 65 year old female who presents to surgery for  Left breast nipple free graft with reduction and Right breast implant exchange     Denies fever, chills, cough, ST, flu-like symptoms, recent infections, CP, SOB.  Allergies Adhesive tape, DMD, neosporin, ancef, codeine, morphine   Overall feeling well. No major complaints today. Ready to proceed.     History taken from EMR, adapted from surgeon's office note dated 09/02/2020, edited as needed by me:   Lynn Tran 6 year oldfemale with h/o BRCA1+ ER- right breast cancer at age 40, then recurrence at age 29 s/p mastectomy, s/p TAH/BSO for uterine carcinosarcoma at age 80 and early stage rectal cancer s/p excision at age 61.     Pt previously met with Lynn Hurter, MS, [facility] in 07/2018 due to her personal and family h/o cancer. Pt has h/o right breast cancer (ER-) at age 68, s/p lumpectomy and radiation. Recurrence at age 56 led to right mastectomy and chemotherapy (CMF). She was diagnosed with poorly differentiated carcinosarcoma of the uterus at age 75, s/p TAH/BSO and radiation. She had her first colonoscopy in 2011 and was diagnosed with early stage rectal cancer s/p transanal excision at [facility] 06/2018.  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    Her medical hx and risk factors as follows:  OB History   Gravida Para Term Preterm AB Living   2       1     SAB IAB Ectopic Multiple Live Births       1          # Outcome Date GA Lbr Len/2nd Weight Sex Delivery Anes PTL Lv   2 Gravida            1 Ectopic               Obstetric Comments   Pt states she had a molar pregnancy        Past Medical History  Past Medical History:   Diagnosis Date    Asthma     BRCA gene mutation positive in female 2020    Breast cancer (CMS-HCC) 1984    right, and recurrence 55, age 47    Ectopic pregnancy      Lymphedema of right arm     NO IV/BP    Molar pregnancy     tx with metotrexate at age 63 yo     PONV (postoperative nausea and vomiting)     Rectal cancer (CMS-HCC) 06/2018    Screening for cervical cancer 09/28/2018    09/25/2018 - s/p hysterectomy for uterine cancer; defer to GynOnc re: if pap needed though unlikely. 10/29/18 - message to Lynn Tran re: confirm no longer needing paps with h/o squamous metaplasia on cervical pathology 10/31/18 - no longer needs paps confirmed.    Uterine cancer (CMS-HCC)     carcinosarcoma       Past Surgical History  Past Surgical History:   Procedure Laterality Date    h/o  S/p robotic assisted transanal excision of previous polypectomy site within the distal rectum  05/2018    TOTAL ABDOMINAL HYSTERECTOMY W/ BILATERAL SALPINGOOPHORECTOMY  1998    MASTECTOMY Right 1995    breast cancer recurrence    BREAST LUMPECTOMY Right 1984    H/o ectopic pregnancy  TONSILLECTOMY AND ADENOIDECTOMY         Family History  Family History   Problem Relation Name Age of Onset    Breast Cancer Mother  55        Recurrence 62    Melanoma Cancer Mother  22    Heart Attack Father  58    Breast Cancer Paternal Grandfather          Diagnosed in 32s    Colon Cancer Maternal Uncle          12s    Other Maternal Uncle          Cushing's    Breast Cancer Paternal Aunt          32s       Allergies  Allergies   Allergen Reactions    Adhesive Tape Rash    Dmdm Hydantoin Hives    Neosporin [Bacitracin-Polymyxin B] Rash    Ancef [Cefazolin] Other     Delayed urticaria; significant uncertainly as may have been due to concurrently administered opioid    Codeine Nausea and Vomiting    Morphine Nausea and Vomiting    [Other] Other     Positive patch testing to:  Mercapto mix  quaternium 15  Imidazolidinyl urea  Neomycin  parthenolide  formaldehyde       Medications  Current Facility-Administered Medications   Medication Dose Route Frequency Provider Last Rate Last Admin     acetaminophen (TYLENOL) tablet 975 mg  975 mg Oral Once Cyndee Brightly, MD        lactated ringers infusion   IntraVENOUS Continuous Cyndee Brightly, MD 30 mL/hr at 10/11/20 0754 New Bag at 10/11/20 0754    lidocaine (XYLOCAINE) 1% PF injection 0.1 mL  0.1 mL IntraDERMAL Once PRN Cyndee Brightly, MD           Social History  Social History     Socioeconomic History    Marital status: Significant Other   Tobacco Use    Smoking status: Never Smoker    Smokeless tobacco: Never Used   Substance and Sexual Activity    Alcohol use: Yes     Alcohol/week: 14.0 standard drinks     Types: 14 Standard drinks or equivalent per week    Drug use: No    Sexual activity: Yes     Partners: Male   Social History Narrative    Pt born and raised in [place]. She was living in [place] when her mother's health started to deteriorate. She moved back to [facility] to take care of her mother, who passed 05/2020. Pt will move back to [facility] 02/2021     Social History     Tobacco Use   Smoking Status Never Smoker   Smokeless Tobacco Never Used     Social History     Substance and Sexual Activity   Alcohol Use Yes    Alcohol/week: 14.0 standard drinks    Types: 14 Standard drinks or equivalent per week     Social History     Substance and Sexual Activity   Drug Use No       Problem List  Patient Active Problem List   Diagnosis    Gastroesophageal reflux disease    Mild persistent asthma without complication    Contact dermatitis    Herpes simplex type 1 infection    History of torn meniscus of left knee    History of breast cancer    History  of uterine cancer    History of cancer    Lesion of skin of foot    History of urticaria    Chronic pain of left knee    History of rectal cancer    FH: melanoma    BRCA1 positive    Encounter to establish care    Adverse effect of bisphosphonates    Vitamin D deficiency    Elevated glucose    Skin lesion    Elevated blood pressure reading without diagnosis of hypertension     Cold sore    Insomnia    Encounter for herb and vitamin supplement management    Alcohol use    Snores    Complex care coordination    Abnormal LFTs    Dyslipidemia    Postnasal drip       Physical Exam  BP 131/78    Pulse 78    Temp 97 F (36.1 C)    Resp 17    Ht '5\' 9"'  (1.753 m)    Wt 87.5 kg (192 lb 12.8 oz)    SpO2 100%    BMI 28.47 kg/m   Body mass index is 28.47 kg/m.  General: Alert and oriented, well-appearing, NAD, pleasant mood and affect  Psych: Normal speech and thought processes  HEENT: NC, AT. EOMI. Sclerae anicteric  Resp: Breathing comfortably, speaking full sentences, O2 sat normal  Cardiovascular: no LE edema  Breast: Bilateral breasts marked by Dr. Lawernce Keas.  Musculoskeletal: No lymphedema of upper extremity  Extremities: No erythema, cyanosis, swelling.   Neuro: Mental status normal, no facial droop, gait normal    Plan/Orders:  Continue with surgery as scheduled     All questions answered.   Pt verbalizes understanding of the above and agrees to proceed.    Doyce Loose, [place] 10/11/2020   9:07 AM  pager [phone]

## 2020-10-11 NOTE — Anesthesia Procedure Notes (Signed)
Regional Anesthesia Block Procedure Note  Date & Time: 10/11/2020 8:25 AM    Short Procedural Summary (Full description of each separate nerve block  below):  Procedure #1 -  Thoracic Paravertebral.      Block type - Single injection(s).                          Medications Administrations:  Medications Administered at:  10/11/2020 8:25 AM    Indications  Procedure performed to address pain in the Chest wall/Breast/Axilla.  Laterality:  Bilateral  Patient seen and examined in the PreOp area.  This procedure was performed at the request of the referring physician for post operative pain control.  Referred by attending surgeon        Universal protocol  Verbal consent obtained, written consent obtained, risks and benefits discussed, patient states understanding of the procedure being performed, the patient's understanding of the procedure matches consent given, procedure consent matches procedure scheduled, relevant documents present and verified, test results available and properly labeled, site marked, imaging studies available, required blood products, implants, devices, and special equipment available and Immediately prior to procedure a time out was called to verify the correct patient, procedure, equipment, support staff and site/side marked as required  Consent given by: patient  Patient identity confirmed by: verbally with patient, arm band, provided demographic data and hospital-assigned identification number  H&P: H&P Reviewed  Vital signs monitored: Yes  Sterile skin prep: Choloraprep (Chorahexadine)  O2 administered: Face Mask    Patient Instructions        Block Procedure #1  Block procedure type: Thoracic Paravertebral    Levels: T 3-4  Nerve block:  Single injection(s)  Needle gauge to anesthetize skin entry site: 27G  Needle gauge used to perform procedure: 20g and Touhy  Block technique: Ultrasound guided  Ultrasound guidance used to identify relevant anatomy.  Ultrasound guidance used to visualize  local anesthetic spread around nerve(s).  Ultrasound guidance used to guide needle to targeted nerve and avoid vascular puncture.    Catheter type: Non-stimulating  Brief medication summary  IV sedation as documented on MAR. See MAR for full medication details  Local anesthetic medication: Ropivacaine    Local anesthetic volume: 20 mL  Complications: None        Block Procedure #2    Block Procedure #3    Block Procedure #4    Block Procedure #5    Block Procedure #6

## 2020-10-11 NOTE — H&P (Signed)
Pre-Operative Surgery History and Physical      Patient Name: Lynn Tran  MRN: [id number]  Room#: KOP PERIOP/KOP PERIOP        ID: Lynn Tran is a 65 year old presenting for left breast free nipple graft technique wise patter breast reduction, right breast revision with implant removal and placement of silicone implant for symmetry.        Patient Active Problem List    Diagnosis Date Noted    Abnormal LFTs 11/29/2019     11/26/2019 - AST/ALT 28/27 minimally elevated by conservative ULN 25; normal GGT. History notable for 2 drinks/day, no other risk factors. Especially given such minimal rises discussed and patient preferring to hold on close f/u (and accept risk here) and recheck 1 year (include Hepatitis A & B serology). Normal iron panel, HCV, and recent liver imaging (MRCP) all very reassuring. Felt low risk.      Dyslipidemia 11/29/2019     11/26/2019 - TC: 228->212. TG: 79->65. HDL: 69->64. LDL: 143->136. Low risk by risk calculator but would check NMR+Lp(a) next year.      Postnasal drip 11/29/2019     11/26/2019 - at night, trial Atrovent.      Elevated blood pressure reading without diagnosis of hypertension 11/18/2019     11/16/2019 - blood pressure 138/89. Not open for home blood pressures so checked Omron x 3 after 5 minutes unattended: average 115/89 is still elevated (though improved). Discuss at follow-up.  11/26/2019 - Recent Omron x 3 Average: 121/87 - at Dr. Dorothyann Peng it was 117/83. Full discussion here, including that drug treatment would not be necessary for diastolic average 32-67 ("stage 1 hypertension"). Patient declines best recommendation (home blood pressures), accepts risk missed diagnosis here. Felt low risk.      Cold sore 11/18/2019     11/16/2019 - episodic valtrex refilled.      Insomnia 11/18/2019     11/16/2019 - taking OTC, not sure which. Patient to bring to f/u.  11/26/2019 - patient is taking diphenhydramine. It also helps her PND. Takes xyzal HS too. Stress component  (caregiver). Counseled on risks of benadryl (cognitive) and advised nasal Atrovent for PND and discussed options for sleep: melatonin 0.5 mg, natural calm magnesium, CBD, 4-7-8 breathing exercises.      Encounter for herb and vitamin supplement management 11/18/2019     11/16/2019 - Discussed biotin risk of affecting troponin and TSH. Pending assessment of cardiac risk may be best to avoid biotin.  11/26/2019 - elevated B12; reports gets B12 shots through weight clinic.      Alcohol use 11/18/2019     11/16/2019 - 2/night. Advised best 1/night; discussed utility GGT, decision to check.      Snores 11/18/2019     11/16/2019 - can wake self from loud snoring. Alcohol 2/night. No daytime tiredness. Wakes red eyes.. Consider sleep study pending blood pressure. Evidence of allergic rhinitis on examination; asymptomatic. Hold on treatment for now.      Complex care coordination 11/18/2019     Dr. Catha Gosselin (Oncology)  Dr. Kathrine Haddock (Breast Surgeon)  Dr. Trish Fountain (Dermatology)  Dr. April Manson (Gastroenterology)  Dr. Ileana Roup (Orthopedics)      Skin lesion 03/16/2019     03/16/19 - per call center "Pt  Called in regards to a discoloration on her hand and a lump underneath. Pt is requesting to be referred to a dermatologist. I Stated she should schedule a appointment with the provider to discuss and order out. Pt didn't want to schedule  an appointment right now, please advise. " Referral placed. To be informed insurance may require visit.  04/12/19 - Benign likely, plan for biopsy.      FH: melanoma 09/28/2018     09/25/2018 - Followed by Dermatology in past, now overdue for f/u. Referral placed.  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 Plan to see GynOnc and Breast Clinic.  11/16/2019 - didn't f/u w/[facility] due to insurance issues. Dr. Catha Gosselin note from 03/02/2019 reviewed with discussion of prophylactic contralateral  mastectomy, tamoxifen, risk pancreatic cancer and pancreatic MRI. Referral for mastectomy consideration: patient reports cannot do surgery until something happens with her mom (wouldn't be able to recover from operation). Dr. Kathrine Haddock is the surgeon she was sent to. Having breast MRI July 1 and to see Dr. Catha Gosselin next week. And mammogram in 6 mo too. Had pancreas MRI. Request records.  11/26/2019 - reports soon to start tamoxifen through Dr. Catha Gosselin.  11/30/19 - 06/2019 evaluation Dr. Alben Deeds reviewed. Option for left breast mastectomy and prophylactic sentinel node biopsy w/reconstruction or medication (e.g. Tamoxifen or AI). Plan for alternating MRI & mammogram every 6 mo and 6 mo f/u there.      Encounter to establish care 09/28/2018     09/25/2018 - Records from Dr. Leotis Pain, DO (previous PCP) requested. To have immunization records input.      Adverse effect of bisphosphonates 09/28/2018     09/25/2018 - patient reports took for 7-8 years but complicated by bone spurs in mouth and no longer taking. Needs DEXA when COVID-19 pandemic allows.  11/16/2019 - Recalls fosamax and boniva after uterine cancer; recalls being on for 6 years (stopped due to side effect concern -bone sput, x 2 from jaw, very painful). Reports history of osteopenia and 10 years since last DEXA. Declines DEXA today. To be open in future, to rediscuss.  11/26/2019 - Dr. Catha Gosselin ordered DEXA, patient to get.      Vitamin D deficiency 09/28/2018     09/25/2018 - taking 4000 IU, to check level at July annual  11/16/2019 - not sure of dose, recheck ordered  11/26/2019 - Vitamin D 55.5->42.1; continue current dosing; recheck 1 year.      Elevated glucose 09/28/2018     09/25/2018 - noted on chart review; A1c in July  11/26/2019 - A1c 5.3      History of cancer 07/16/2018     07/14/2018 - Referral to Genetics for "Multiple cancers: breast (twice), uterine, colon" [diagnosed with BRCA1)\]      Lesion of skin of foot 07/16/2018     07/14/2018 - Suspect  foreign body vs wart. Likely need procedure. Referral to Podiatry.  09/25/2018 - reports foreign body came out with home wart treatment, remove from Problem List.  11/16/2019 - noted recent cryotherapy for suspect warts. Patient reports saw outside Podiatrist with diagnosis porokeratosis and was treated with extraction and silver nitrate. Referral to Podiatry.      History of urticaria 07/16/2018     07/14/2018 - Very likely from medication during recent colon cancer surgery (ancef vs opioid) 10 days ago. Discussed that could end up being chronic (6+weeks) and may need labs and Allergy referral; low suspicion this will occur. Discussion of prednisone risks and together plan was formulated through shared decision making. The following discussed with patient and written into AVS: "- Daily zyrtec (ok to  increase to twice daily).   - Ok for benadryl 25-28m at night for itching  - famotidine 242mtwice daily  - Wait 1-2 days and if above measures aren't helping start the prednisone. Take 6072maily for 5-7 days, tapering is option  - We decided to hold on Allergy referral for now"   09/25/2018 - Resolved, discussed that as significant possibility hives from antibiotics eventual plan for Allergy referral.  11/16/2019 - plan for Allergy referral discussed; patient declining at this time though likely will be open in the future.      Chronic pain of left knee 07/16/2018     07/14/2018 - Asking for ibuprofen 800m103mr anticipated episodes severe pain. Not evaluated fully. #30 prescribed. Discussed risks, refills require more discussion (visit)  11/16/2019 - doing Supartz with Dr. KimbIleana Roup/05/2019 - patient informed not willing to prescribe this; risks are too high and patient can take OTC on her own if she wants to take the significant risks from the ibuprofen 800 mg. Risks discussed      History of rectal cancer 06/26/2018     07/14/2018 - Recent surgery for this 07/04/2018 with f/u 3 weeks from then with Dr.  BeieFredda Hammed30/2020 - Patient preferring to work with different surgeon, new referral placed  11/16/2019 - Reviewed that unremarkable colonoscopy 07/16/2019 with repeat due 2024 (Dr. LenzApril Manson/05/2019 - Negative CEA (was requested by Oncologist Dr. WallCatha Gosselin     Gastroesophageal reflux disease 09/18/2017     11/16/2019 - First discussion today. Was not aware of long-term side effects. Taking 8-10 years.Aware that can flare her asthma. No prior EGD. Discussed risks, B12, magnesium deficiency, C. Diff, PNA, osteoporosis, possible renal. Does have Barrett's risk factors. Monitor B12. Patient would be open to wean; consider EGD (but not at present moment)    Medication: Omeprazole 20 mg daily      Mild persistent asthma without complication 04/245/40/9811 09/25/2018 - Lifelong diagnosis since age 4. 65ry stable on current medications. PF recommended today. Rare albuterol use. To need pneumovax [was completed]    Medications: Symbicort 80, albuterol prn      Contact dermatitis 09/18/2017     09/25/2018 - very extensive history here with multiple opinions and finally with patch testing with delayed result confirming DMDM hydantoin as causal agent. Has used clobetasol foam for mild recurrences if accidentally exposed; prescription today for cream as cheaper likely.  11/16/2019 - recalled; refill prescription. Still occurs from exposures.      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      History of torn meniscus of left knee 09/18/2017    History of breast cancer 09/18/2017     Diagnosed at 27, 51ad a lumpectomy and radiaton.   Came back 11 years later, got R sided mastectomy with reconstruction and chemotherapy.  11/16/2019 - Followed by Dr. WallCatha Gosselin Dr. ToosKathrine Haddocke "BRCA1 positive"      History of uterine cancer 09/18/2017     High grade uterine cancer. 1998, total hysterectomy at that time and radiation   10/29/18 - Annual follow-up with Gynecology  Oncology (next due June 2021). Needs to have pelvic examination yearly as she still has a small risk of developing a primary peritoneal cancer or a recurrence of her uterine cancer.   11/16/2019 - did not f/u with [facility] (insurance) so has been followed by Dr. WallCatha Gosselin Dr.  Toosie. See "BRCA 1"         Past Medical History:   Diagnosis Date    Asthma     BRCA gene mutation positive in female 2020    Breast cancer (CMS-HCC) 1984    right, and recurrence 1995, age 72    Ectopic pregnancy     Lymphedema of right arm     NO IV/BP    Molar pregnancy     tx with metotrexate at age 76 yo     PONV (postoperative nausea and vomiting)     Rectal cancer (CMS-HCC) 06/2018    Screening for cervical cancer 09/28/2018    09/25/2018 - s/p hysterectomy for uterine cancer; defer to GynOnc re: if pap needed though unlikely. 10/29/18 - message to Evert Kohl re: confirm no longer needing paps with h/o squamous metaplasia on cervical pathology 10/31/18 - no longer needs paps confirmed.    Uterine cancer (CMS-HCC)     carcinosarcoma       Past Surgical History:   Procedure Laterality Date    h/o  S/p robotic assisted transanal excision of previous polypectomy site within the distal rectum  05/2018    TOTAL ABDOMINAL HYSTERECTOMY W/ BILATERAL SALPINGOOPHORECTOMY  1998    MASTECTOMY Right 1995    breast cancer recurrence    BREAST LUMPECTOMY Right 1984    H/o ectopic pregnancy      TONSILLECTOMY AND ADENOIDECTOMY           Current Facility-Administered Medications   Medication Dose Route Frequency Provider Last Rate Last Admin    acetaminophen (TYLENOL) tablet 975 mg  975 mg Oral Once Cyndee Brightly, MD        lactated ringers infusion   IntraVENOUS Continuous Cyndee Brightly, MD 30 mL/hr at 10/11/20 0754 New Bag at 10/11/20 0754    lidocaine (XYLOCAINE) 1% PF injection 0.1 mL  0.1 mL IntraDERMAL Once PRN Cyndee Brightly, MD             Allergies   Allergen Reactions    Adhesive Tape Rash    Dmdm  Hydantoin Hives    Neosporin [Bacitracin-Polymyxin B] Rash    Ancef [Cefazolin] Other     Delayed urticaria; significant uncertainly as may have been due to concurrently administered opioid    Codeine Nausea and Vomiting    Morphine Nausea and Vomiting    [Other] Other     Positive patch testing to:  Mercapto mix  quaternium 15  Imidazolidinyl urea  Neomycin  parthenolide  formaldehyde         Family History   Problem Relation Name Age of Onset    Breast Cancer Mother  41        Recurrence 39    Melanoma Cancer Mother  20    Heart Attack Father  93    Breast Cancer Paternal Grandfather          Diagnosed in 41s    Colon Cancer Maternal Uncle          22s    Other Maternal Uncle          Cushing's    Breast Cancer Paternal Aunt          70s       Social History     Socioeconomic History    Marital status: Significant Other     Spouse name: Not on file    Number of children: Not on file    Years of education:  Not on file    Highest education level: Not on file   Occupational History    Not on file   Tobacco Use    Smoking status: Never Smoker    Smokeless tobacco: Never Used   Substance and Sexual Activity    Alcohol use: Yes     Alcohol/week: 14.0 standard drinks     Types: 14 Standard drinks or equivalent per week    Drug use: No    Sexual activity: Yes     Partners: Male   Other Topics Concern    Not on file   Social History Narrative    Pt born and raised in [place]. She was living in [place] when her mother's health started to deteriorate. She moved back to [facility] to take care of her mother, who passed 05/2020. Pt will move back to [facility] 02/2021     Social Determinants of Health     Financial Resource Strain: Not on file   Food Insecurity: Not on file   Transportation Needs: Not on file   Physical Activity: Not on file   Stress: Not on file   Social Connections: Not on file   Intimate Partner Violence: Not on file   Housing Stability: Not on file       Review Of Systems  As noted in  HPI/PMH      Physical Exam:   Vitals: BP 139/87 (BP Location: Right arm)    Pulse 95    Temp 97 F (36.1 C)    Resp 16    Ht _0  (1.753 m)    Wt 87.5 kg (192 lb 12.8 oz)    SpO2 98%    BMI 28.47 kg/m       General Description: Alert and oriented. Affect normal.     Chest and Lungs: No deformity.     Heart: Regular rate and rhythm.     Abdomen: Soft, non-distended, non-tender.    Integument: No active lesion.    Neurologic: No focal deficit    Musculoskeletal: Moves all extremities without apparent deficit    Peripheral Vascular: extremities warm, well perfused    Recent Labs:  Lab Results   Component Value Date    NA 144 11/19/2019    K 4.7 11/19/2019    CL 108 (H) 11/19/2019    BICARB 21 11/19/2019    BUN 16 11/19/2019    CREAT 0.88 11/19/2019    GLU 88 11/19/2019    CA 9.5 11/19/2019    AST 28 11/19/2019    ALT 27 11/19/2019    ALK 73 11/19/2019    TBILI 0.5 11/19/2019    ALB 4.4 11/19/2019    TP 6.5 11/19/2019       Lab Results   Component Value Date    WBC 4.0 11/19/2019    RBC 4.16 11/19/2019    HGB 13.5 11/19/2019    HCT 39.3 11/19/2019    MCV 95 11/19/2019    MCHC 34.4 11/19/2019    RDW 13.1 11/19/2019    PLT 298 11/19/2019    LYMPHS 40 11/19/2019    MONOS 12 11/19/2019    EOS 3 11/19/2019    BASOS 1 11/19/2019       No results found for: PT, INR, PTT      Assessment  Preoperative history and physical completed for Consented Surgical Procedure.    PLAN  Proceed to OR for scheduled surgery    Code Status: Full Code/Full Care

## 2020-10-11 NOTE — Discharge Instructions (Signed)
Same Day Surgery Instructions for Breast Plastic Surgery    You may experience any of the following after an operation:  Soreness from incision.  A sore throat if you were put to sleep and a breathing tube was placed in your throat.  Sleepiness from anesthesia or other drugs given to you prior to or during your surgery.  Fatigue just from having surgery.  Nausea occurs sometimes, but depends largely on your reaction to surgery and drugs.    These discomforts should improve rapidly and are usually gone the day following surgery.     Medications:  OK to take tylenol for pain relief. Take as directed on the bottle.   After three days from surgery, OK to take ibuprofen as needed for pain. Take as directed on the bottle.     Prescriptions sent to your preferred pharmacy:   - Pain medication. Please take as directed on bottle.       Pain pills can cause drowsiness and sleepiness.        DO NOT mix with other sedatives, like sleep medications, anxiety medications, or alcohol.       DO NOT drive while taking pain medications.   - Constipation medication. Please take as directed on the bottle. Pain medications can cause constipation. This medicine should help prevent severe constipation.       Please call: [phone] if you go more than 3 days without a bowel movement.  - We may have sent you home with antibiotics. If so, please follow directions on the bottle.        If any rash appears please STOP IMMEDIATELY and call our clinic.        If problems breathing, or swelling in mouth/tongue/lips please STOP IMMEDIATELY and SEEK IMMEDIATE CARE.          *If you are taking tamoxifen, you may restart 7 days (1 week) after your surgery.     Dressing and/or incision care:  Leave your post-op garment and dressings in place until your post-op appt.     If you have a JP Drain in place:   - Empty drains and record amount twice a day.        We will remove the drains when there is less than 20-30cc of fluid for two days in a row. This  can be up to 14 days after surgery date.         You may have to 'strip' the drain tubing to move the drainage to the bulb and prevent clots.   - Do NOT place any water or a skewer inside the bulb. It is a sterile reservoir.   - Drains should not need to be emptied more than twice a day.   - If your drain bulb is filling up quickly and needs to be emptied more than twice a day, please call the after hours line and request to speak with Dr. Juleen China directly.     To empty your JP Drain:   1. Wash your hands with soap and water for 30 seconds (sing the alphabet song from A to Z)  2. With the cap facing up to the sky, turn your JP-Drain bulb upside down   3. Read and record the volume and color of fluid inside the JP-Drain bulb  4. Open the cap of the JP-Drain bulb without touching the inside of the cap   5. Empty fluid into the toilet and flush  6. Compress the JP-Drain bulb and place  cap back on  7. Wash your hands with soap and water    Bathing:  Do not remove your dressings after surgery.  Leave your post-op garment and dressings in place until your post-op appt.  You may clean your non surgery areas with soapy washcloth and pat dry.     Diet:    Progress to your normal diet as tolerated.   You may experience mild nausea after anesthesia. Some people prefer light meals or soup for the first 24 hours after surgery.      Activity:  No heavy lifting for 4-6 weeks. Nothing more than 10 pounds, which is about a gallon of milk.  Avoid strenuous activities for at least one month, or until cleared by surgery team.  Avoid driving or operating machinery while you taking pain medication    Return Appointment:  Future Appointments   Date Time Provider [place]   10/14/2020  1:30 PM Jetty Peeks, NP [place]   12/28/2020  1:40 PM Loran Senters, MD [facility]           Any Questions and/or Symptoms please call your Nurse Case Manager:   Oscar La, NCM @ [phone]  Maisie Fus, NCM  @ [phone]  Call your if you experience any of the following symptoms:   -Fever of 100.25F (oral temperature)   -Redness of the skin at incision site   -Drainage   -Increased tenderness at the surgical site   -Cloudy, foul-smelling drainage     For any symptoms that occur during the weekend or after 5pm Monday through Friday, please call the [facility] Operator @ [phone] or [phone] and inform the operator that your surgeon, Dr. Lawernce Keas, needs to be directly contacted for the post-surgical symptoms that you are experiencing. Please do not ask for the "on call" team.

## 2020-10-12 ENCOUNTER — Telehealth (HOSPITAL_BASED_OUTPATIENT_CLINIC_OR_DEPARTMENT_OTHER): Payer: Self-pay

## 2020-10-12 NOTE — Patient Instructions (Signed)
[facility] PHONE LIST FOR PATIENTS  Hours of operation: Monday-Friday 8:00 - 5:00pm, Closed Holidays and Weekends      AFTER HOURS EMERGENCY NUMBER: [phone] Ask for On-Call Surgeon for Surgical Symptoms. As for On-Call Medical Oncologist for Medical Symptoms     Administrative Assistant (Michelle Milo) for Dr. Anne Wallace: [phone]  Administrative Assistant (Samantha Salde) for Nurse Practitioner Vince Genna: [phone]     Nurse Case Managers Irene Mitcham W., RN @ [phone] ,  Shauna W., RN @ [phone], Bona R., RN [phone]    +++++++++++++++++++++++++++++++++++++++++++++++++++++++++++++++++++++++++++++++++++++++  If you would like to provide us feedback on how we did, you can contact the Patient Experience 'We Listen' Department via telephone, Email, or by mail.    E-mail: [email or web address] Phone: [phone]   Mail:Patient Experience  [facility] , [address], [address], [place], [place] [address]      Thank you for the opportunity to care for you during this time.

## 2020-10-12 NOTE — Anesthesia Follow Up (Signed)
Regional Anesthesia Telephone Encounter Follow-Up Note  Peripheral Nerve Block    History of Present Illness:     Lynn Tran is a 65 year old female who is POD 1 status post   Left breast free nipple graft technique wise pattern breast reduction   ricomplex right breast revision with capsulectomy, readadvancement of pectoralis muscle, implant removal and placement of silicone implant for symmetry, and lipo of axillary tail     . The patient had a left paravertebral peripheral nerve block placed for post-operative pain/intraoperative anesthesia.     Regarding the pain, patient reports pain score: Controlled.    Patient reports:  Nerve block site is clean and dry without erythema, induration or tenderness to palpation  Sensory block: absent  Motor block: absent    Assessment:  This is a 65 year old female with post-operative/acute pain who had excellent pain control with regional nerve block.    Plan:   Block should have resolved, vm left    Patient advised to call [phone] and page the regional anesthesia fellow/resident on-call with any questions or concerns.    Note Author: Helen Hashimoto, MD

## 2020-10-12 NOTE — Telephone Encounter (Signed)
Patient s/p 05/78/22    s/p  10/11/2020 Left breast free nipple graft technique wise pattern breast reduction ricomplex right breast revision with capsulectomy, readadvancement of pectoralis muscle, implant removal and placement of silicone implant for symmetry, and lipo of axillary tail    Post op call and follow up appointment reminder     Patient reports the following:  1) Pain:  2/10 "  2) Taking narcotics?   Norco prescribed, took one last night and one today. Ok to start taking 600mg  Ibuprofen q6 for pain 72 hours post op   3) BMs: Colace prescribed. Pt educated patient on the importance of PO fluid intake, and movement around to prevent constipation in addition to stool softener use.   4)Taking antibiotics as prescribed (Bactrim)   5) Mobility/ ROM Participating without difficulty   6) Fever, malaise, purulent drainage, or other sx of infection:  Encouraged to call ASAP with any new or concerning symptoms. Voice is hoarse from anesthesia   7) JPs:  Draining a lot and estimates 146ml from right side and less than 50ml on the left. Describes that it it starting to lighten up     Patient reminded of the following:  -Wait to shower until 3 days post-op  No soaking in a tub.   -In the case of lumpectomy, please remove clear dressing and guaze 72 hours after surgery and shower, no need to replace the gauze. DO NOT remove steri-strips, made to get wet and will come in 10-14 days.  -In the case of Mastectomy, keep surgical bra on until post op visit   -After showering, patients may switch to comfortable (not too tight) sports bra or other gentle support.  -No heavy lifting/pushing/pulling (>10 lb) x 1 month.    Patient has f/u appointment scheduled on 10/14/20 @1330 , and relayed that pathology will be discussed at this appointment. Pt wants to extended her gratitude/compliments  to all [facility] staff that interacted with her for the for attention and kindness

## 2020-10-12 NOTE — Interdisciplinary (Signed)
Return Visit with NP Genna on 10/13/20    H/O: 1984 Right Breast Infiltrating Ductal Cancer, S/P Lumpectomy and XRT at age 65, Reoccurrence at age 65 S/P Mastectomy with recon and adjuvant chemo - offered endocrine therapy but patient declined    H/O:poorly differentiated carcinosarcoma of the uterus at age 65, s/p TAH/BSO - followed by radiation     H/O: 2020 Rectal cancers/p robotic assisted transanal excision of previous polypectomy site within distal rectum    BRCA1+    s/p  10/11/2020 Left breast free nipple graft technique wise pattern breast reduction ricomplex right breast revision with capsulectomy, readadvancement of pectoralis muscle, implant removal and placement of silicone implant for symmetry, and lipo of axillary tail     Nursing Assessment    Pt arrives alert and orientated, accompanied by    Patient reports the following concerns:     Pain   BM:   ROM:   ABX:   Drains      Medications and allergies updated and reviewed in EPIC    Outcome/ Plan    -RTC   -Patient provided with AVS, which was reviewed thoroughly prior to discharge.

## 2020-10-12 NOTE — Op Note (Signed)
DATE OF SERVICE:  10/11/2020    PREOPERATIVE DIAGNOSIS:    1. Acquired absence, right breast; past history of right mastectomy  for breast carcinoma; complete distortion of right implant with lack  of symmetry, lack of projection, and total asymmetry with left  breast.   2. Left macromastia.    POSTOPERATIVE DIAGNOSIS:    1. Acquired absence, right breast; past history of right mastectomy  for breast carcinoma; complete distortion of right implant with lack  of symmetry, lack of projection, and total asymmetry with left  breast.   2. Left macromastia.  3. Severely encapsulated, thickened capsule with nodules and loss of  domain of pectoralis muscle.     PROCEDURE PERFORMED:    1. 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   2. Left reduction mammoplasty via Wise pattern free nipple graft  technique.   3. Liposuction, right upper chest wall, for improved contour.    SURGEON/STAFF:  Cyndee Brightly, MD    ASSISTANT:  Doyce Loose, PA    ANESTHESIA:  General endotracheal.    INDICATION FOR UNUSUAL PROCEDURE:  This patient came to me from the  outside with a very disfigured right breast reconstruction.  She has  no projection.  Her right breast was flattened and the left breast  was large and pendulous.  This particular surgery was extremely  unusual in the sense that she was so disfigured with her  reconstruction, and then once we were in the operating room, we found  an extremely thickened capsule with nodules, which required [place] to  then do a near-complete capsulectomy and required then readvancement  of the entire pectoralis muscle, which I had not planned to do, as I  had thought going in it would be just revision of the inside  pocket.  This added 90 minutes to the surgery and it created unexplained  pathology, which we needed to make sure we accounted for.     DESCRIPTION OF PROCEDURE:  She was brought to the operating room,  placed in the supine position, placed under general endotracheal  anesthesia.  Her bilateral breasts were prepped and draped in a  sterile manner.  Doyce Loose, PA assisted with the surgery because  no qualified plastic surgery resident was available.  She had been  marked out for a Wise pattern reduction on the left; but because of  prior surgery and very large, pendulous breasts, I felt that we would  not get a good symmetry if we kept the entire pedicle, and the nipple  had risk of dying, so we planned a free nipple graft technique; and  on the right side I had planned to open and revise the pocket and go  with a larger profile implant, but again found things that we did not  expect.  So we started on the right.  The mastectomy scar was opened  along its length down through the skin and subcutaneous tissue.  Dissection was carried down through the muscle, and as soon as we did  that, a very thickened capsule was seen.  The intact 500 cc moderate  profile Mentor implant was removed and sent fresh to Pathology.  We  then inspected the pocket, and there were multiple nodules laterally  and  inferiorly, and so a near 100% capsulectomy was performed.  The  only capsule that was left was on the ribcage where it was extremely  adherent and dangerous to remove.  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  This now left [place] with a big pocket.  In looking at her  plane, she must have had a biplanar approach, and so there was no  muscle down below.  Simply placing an implant under thin skin with no  capsule now was going to create possible infection problems, et  Ronney Asters, so what we did now was elevate the skin off the pectoralis  muscle.  This was done  superiorly and laterally, and then inferiorly  we elevated the rectus fascia upward.  This took a great deal of time  in the operating room to completely facilitate.  We then started  working with sizers to see where we could be in conjunction with what  size she needed to be and where we could be with complete coverage;  and as we did that, we were able to advance the pectoralis muscle  more inferiorly and laterally so that we could achieve a closure.  So  we pulled out the sizer.  We irrigated with pulse lavage.  We finally  decided on a 615 cc SRX Allergan silicone gel implant.  The pocket  was rinsed in Betadine saline and the muscle was closed laterally,  medially, and as much as possible so that the muscle pocket was now  small; and a Keller funnel was used to sterilely insert the permanent  implant so there was no-touch technique.  We then continued to close  the inferior portion of the muscle laterally and inferiorly up to the  flaps that we had elevated.  Some of the closure went to Scarpa  fascia of the breast as well, but there was 100% closure of tissue  over the implant by the time we were done.  We then looked at the  elevation of skin.  We now had quite a mastectomy pocket, so a 10 mm  Jackson-Pratt drain was placed through a separate stab incision and  sewn in using 3-0 nylon suture and went into this mastectomy flap.  We then trimmed the skin and closed with 3-0 and 4-0 Monocryl deep  dermal sutures.  She was then sat up into place and she still had a  fatty bulge where there was remaining tissue superiorly, and so  tumescent liposuction was done in this area for a total of 125 cc of  fat that was removed in this inferior upper area.  That was done  through the incision, and then that small part of the incision that  was opened was closed with 5-0 fast-absorbing plain gut suture.  We  then worked on the left side.  The nipple-areolar complex was carved  out to 42 mm.  A typical Wise pattern was then  utilized for the  reduction.  The only difference here was that we did not use the  central area as a pedicle.  We trimmed the central area down to  create some projection but trimmed it sufficiently so there would not  be too much projection; so medial and lateral wedges were taken,  central wedges were taken, and then we trimmed the central tissue  down for a total of 410 g eventually to be removed on that side.  That wound was thoroughly  irrigated, small bleeding controlled using  the Bovie.  A 10 mm Jackson-Pratt drain was placed through separate  stab incision and sewn in using 3-0 nylon suture.  The T position was  brought down together with a 2-0 Vicryl, and the skin was temporarily  stapled closed.  The skin was then closed with 3-0 and 4-0 Monocryl  deep dermal sutures.  She was then sat up into place, and the  appropriate recipient bed for the free nipple graft was performed.  A  42 mm cookie cutter was used to de-epithelialize the top of the T  position where the graft was going to go.  The graft was then laid  into place and sutured in with a running 4-0 nylon suture, and then  bolsters were created with 3-0 nylon, cotton balls, and Xeroform.  At  the completion of the surgery, much better shape and form were seen.  We achieved at least 90% of where we wanted to be; she probably will  need some touchup in the future, and this was explained to her.  The  wounds were all dressed with Steri-Strips, Xeroform, Biopatch for the  drains, and she was placed in a bra with a pressure dressing.  She  tolerated procedure well and returned to recovery room stable and  extubated.  Sponge and lap tape counts were correct, and specimens  were reviewed with the circulating nurse.       Job #:  [id number]

## 2020-10-12 NOTE — Op Note (Signed)
DATE OF SERVICE:  10/11/2020      Incomplete note; see second dictation.   Job #:  [id number]

## 2020-10-14 ENCOUNTER — Ambulatory Visit (HOSPITAL_BASED_OUTPATIENT_CLINIC_OR_DEPARTMENT_OTHER): Payer: Medicare Other | Admitting: Nurse Practitioner

## 2020-10-14 ENCOUNTER — Ambulatory Visit: Payer: Medicare Other | Attending: Nurse Practitioner | Admitting: Nurse Practitioner

## 2020-10-14 VITALS — BP 127/74 | HR 95 | Temp 97.9°F | Wt 192.9 lb

## 2020-10-14 DIAGNOSIS — Z853 Personal history of malignant neoplasm of breast: Secondary | ICD-10-CM | POA: Insufficient documentation

## 2020-10-14 DIAGNOSIS — Z09 Encounter for follow-up examination after completed treatment for conditions other than malignant neoplasm: Secondary | ICD-10-CM

## 2020-10-14 LAB — LIMITED LYMPHOMA FLOW CYTOMETRY PANEL

## 2020-10-14 NOTE — Interdisciplinary (Signed)
Return Visit with  NP Genna on 10/14/20    H/O:1984Right Breast Infiltrating Ductal Cancer, S/P Lumpectomy and XRT at age 65, Reoccurrence at age 53 S/P Mastectomy with reconand adjuvant chemo- offered endocrine therapy but patient declined    H/O:poorly differentiated carcinosarcoma of the uterus at age 71, s/p TAH/BSO- followed by radiation    H/O: 2020 Rectal cancers/p robotic assisted transanal excision of previous polypectomy site within distal rectum    BRCA 1- recent testing    s/p5/17/2022Left breast free nipple graft technique wise pattern breast reduction ricomplex right breast revision with capsulectomy, readadvancement of pectoralis muscle, implant removal and placement of silicone implant for symmetry, and lipo of axillary tail       Nursing Assessment  Pt arrives alert and orientated, unaccompanied   Patient reports the following concerns:     Pain: Norco, Stopped   BM: Yes   ROM: Demonstrates at 100%  ABX: Bactrim, no issues   Drains: bilateral drains output is as follows:  Right : 5/17: 20m, 5/18-52ml, 5/19-10ml, 5/20 880m ( 6 mL removed in clinic)  Left : 5/17:  8042m5/18-170ml, 5/19-50ml, 5/20 62m24m ( 8 mL removed in clinic)  Will remove left drain per NP    Medications and allergies updated and reviewed in EPIC      Outcome/ Plan  -Photos sent to MD of recon   -RTC next wednesday for suture removal   -Tolerated drain removal, tip intact, 5-10ml86mressed from cavity following removal   -Bandage applied per protocol  -NO Shower for 24 HOURS   -Remove bandage and shower. Continue showering daily with soap and water and remember to pat dry before returning to compression vest or other garment   -Xeroform to nipple every other day   -Change right dressing bandage everyday   -Patient provided with AVS, which was reviewed thoroughly prior to discharge.

## 2020-10-14 NOTE — Addendum Note (Signed)
Addendum  created 10/14/20 0729 by [place], [place] section accepted, SmartForm saved

## 2020-10-14 NOTE — Patient Instructions (Addendum)
[facility] PHONE LIST FOR PATIENTS  Hours of operation: Monday-Friday 8:00 - 5:00pm, Closed Holidays and Weekends      AFTER HOURS EMERGENCY NUMBER: [phone] Ask for On-Call Surgeon for Surgical Symptoms. As for [facility] Oncologist for Medical Symptoms     Administrative Assistant [facility]) for Dr. Lawernce Keas: [phone]  Administrative Assistant Lorenso Courier) for Nurse Practitioner Charna Archer: [phone]     Nurse Case Managers Diona Browner., RN @ [phone] ,  Chinita Pester., RN @ [phone], Gaynelle Arabian., RN [phone]    +++++++++++++++++++++++++++++++++++++++++++++++++++++++++++++++++++++++++++++++++++++++  If you would like to provide [place] feedback on how we did, you can contact the Patient Experience 'We Listen' Department via telephone, Email, or by mail.    E-mail: [email or web address] Phone: [phone]   Mail:Patient Experience  [facility] , [address] [place], [place], [place]      Thank you for the opportunity to care for you during this time.     Nipple dressing every other day   Right  drain dressing every day

## 2020-10-16 NOTE — Progress Notes (Signed)
This encounter was opened in error.  Please disregard.

## 2020-10-16 NOTE — Progress Notes (Signed)
Reason for Visit  Chief Complaint   Patient presents with    Post-op Visit    Surgical Follow-up        History of Present Illness:     Lynn Tran is a 65 year old female who is here for Post-op Visit and Surgical Follow-up      She returns to clinic for post-op f/u and surgical dsg takedown s/p Left breast free nipple graft technique wise pattern breast reduction ricomplex right breast revision with capsulectomy, readadvancement of pectoralis muscle, implant removal and placement of silicone implant for symmetry, and lipo of axillary tail. She denies any fever, erythema or d/c. And her pain is well controlled per her report.     A/O:[id number] Breast Infiltrating Ductal Cancer, S/P Lumpectomy and XRT at age 65, Reoccurrence at age 75 S/P Mastectomy with reconand adjuvant chemo- offered endocrine therapy but patient declined    H/O:poorly differentiated carcinosarcoma of the uterus at age 87, s/p TAH/BSO- followed by radiation    H/O: 2020 Rectal cancers/p robotic assisted transanal excision of previous polypectomy site within distal rectum    BRCA1+    Patient Active Problem List   Diagnosis    Gastroesophageal reflux disease    Mild persistent asthma without complication    Contact dermatitis    Herpes simplex type 1 infection    History of torn meniscus of left knee    History of breast cancer    History of uterine cancer    History of cancer    Lesion of skin of foot    History of urticaria    Chronic pain of left knee    History of rectal cancer    FH: melanoma    BRCA1 positive    Encounter to establish care    Adverse effect of bisphosphonates    Vitamin D deficiency    Elevated glucose    Skin lesion    Elevated blood pressure reading without diagnosis of hypertension    Cold sore    Insomnia    Encounter for herb and vitamin supplement management    Alcohol use    Snores    Complex care coordination    Abnormal LFTs    Dyslipidemia    Postnasal drip          Past Medical History:   Diagnosis Date    Asthma     BRCA gene mutation positive in female 2020    Breast cancer (CMS-HCC) 1984    right, and recurrence 1995, age 44    Ectopic pregnancy     Lymphedema of right arm     NO IV/BP    Molar pregnancy     tx with metotrexate at age 33 yo     PONV (postoperative nausea and vomiting)     Rectal cancer (CMS-HCC) 06/2018    Screening for cervical cancer 09/28/2018    09/25/2018 - s/p hysterectomy for uterine cancer; defer to GynOnc re: if pap needed though unlikely. 10/29/18 - message to Evert Kohl re: confirm no longer needing paps with h/o squamous metaplasia on cervical pathology 10/31/18 - no longer needs paps confirmed.    Uterine cancer (CMS-HCC)     carcinosarcoma         Past Surgical History:   Procedure Laterality Date    h/o  S/p robotic assisted transanal excision of previous polypectomy site within the distal rectum  05/2018    TOTAL ABDOMINAL HYSTERECTOMY W/ BILATERAL SALPINGOOPHORECTOMY  1998    MASTECTOMY  Right 1995    breast cancer recurrence    BREAST LUMPECTOMY Right 1984    H/o ectopic pregnancy      TONSILLECTOMY AND ADENOIDECTOMY           Allergies   Allergen Reactions    Adhesive Tape Rash    Dmdm Hydantoin Hives    Neosporin [Bacitracin-Polymyxin B] Rash    Ancef [Cefazolin] Other     Delayed urticaria; significant uncertainly as may have been due to concurrently administered opioid    Codeine Nausea and Vomiting    Morphine Nausea and Vomiting    [Other] Other     Positive patch testing to:  Mercapto mix  quaternium 15  Imidazolidinyl urea  Neomycin  parthenolide  formaldehyde         Current Outpatient Medications   Medication Sig Dispense Refill    albuterol 108 (90 Base) MCG/ACT inhaler Inhale 2 puffs by mouth every 6 hours as needed for Wheezing or Shortness of Breath. 8.5 g 11    Biotin 1 MG CAPS       budesonide-formoterol (SYMBICORT) 80-4.5 MCG/ACT inhaler inhale 2 puffs by mouth twice a day 30.6 g 3     Cholecalciferol (VITAMIN D3) 1000 units tablet 4,000 Int'l Units.      CINNAMON PO       clobetasol propionate (TEMOVATE) 0.05 % cream Apply 1 Application topically 2 times daily. Apply to affected area twice a day for recurrent contact dermatitis. 30 g 1    Coenzyme Q10 (CO Q 10 PO)       COLLAGEN PO       Cyanocobalamin 1000 MCG/ML KIT Through weight clinic      diclofenac (VOLTAREN) 1 % gel Apply 4 g topically 4 times daily. Use the plastic dose guide found inside the box to measure the dose. 1 Tube 1    docusate sodium (COLACE) 250 MG capsule Take 1 capsule (250 mg) by mouth 2 times daily. 60 capsule 0    Glucosamine-MSM-Hyaluronic Acd (JOINT HEALTH PO)       HYDROcodone-acetaminophen (NORCO) 5-325 MG tablet Take 1 tablet by mouth every 6 hours as needed for Moderate Pain (Pain Score 4-6). 25 tablet 0    ibuprofen (MOTRIN) 800 MG tablet Take 1 tablet (800 mg) by mouth every 8 hours as needed for Mild Pain (Pain Score 1-3). Refilled require visit. 30 tablet 0    ipratropium (ATROVENT) 0.03 % nasal spray Spray 2 sprays into each nostril At bedtime as needed for Rhinitis. 1 bottle 11    Milk Thistle 250 MG CAPS       omeprazole (PRILOSEC) 20 MG capsule Take 1 capsule (20 mg) by mouth daily. 90 capsule 3    ondansetron (ZOFRAN) 4 MG tablet Take 1 tablet (4 mg) by mouth every 8 hours as needed for Nausea/Vomiting. 25 tablet 0    sulfamethoxazole-trimethoprim (BACTRIM DS, SEPTRA DS) 800-160 MG tablet Take 1 tablet by mouth 2 times daily. 14 tablet 0     No current facility-administered medications for this visit.         Social History     Socioeconomic History    Marital status: Significant Other   Tobacco Use    Smoking status: Never Smoker    Smokeless tobacco: Never Used   Substance and Sexual Activity    Alcohol use: Yes     Alcohol/week: 14.0 standard drinks     Types: 14 Standard drinks or equivalent per week    Drug use:  No    Sexual activity: Yes     Partners: Male   Social History  Narrative    Pt born and raised in [place]. She was living in [place] when her mother's health started to deteriorate. She moved back to [facility] to take care of her mother, who passed 05/2020. Pt will move back to [facility] 02/2021     Social History     Tobacco Use   Smoking Status Never Smoker   Smokeless Tobacco Never Used     Social History     Substance and Sexual Activity   Alcohol Use Yes    Alcohol/week: 14.0 standard drinks    Types: 14 Standard drinks or equivalent per week     Social History     Substance and Sexual Activity   Drug Use No         Family History   Problem Relation Name Age of Onset    Breast Cancer Mother  47        Recurrence 30    Melanoma Cancer Mother  73    Heart Attack Father  80    Breast Cancer Paternal Grandfather          Diagnosed in 7s    Colon Cancer Maternal Uncle          82s    Other Maternal Uncle          Cushing's    Breast Cancer Paternal Aunt          70s       Review of Systems    Pertinent items are noted in HPI.    Physical Exam  Patient is a 65 year old female who appeared alert  BP 127/74    Pulse 95    Temp 97.9 F (36.6 C)    Wt 87.5 kg (192 lb 14.4 oz)    SpO2 97%    BMI 28.49 kg/m   Body mass index is 28.49 kg/m.  Breast:  Incisions CDI with L breast with ecchymosis and bolster dsg in place.     Medical Decision Making  Test Results:  Results for orders placed or performed during the hospital encounter of 10/11/20   Limited Lymphoma Flow Cytometry Panel   Result Value Ref Range    Flow PDF Report FOR PDF RESULTS,CLICK "VIEW IMAGE" BELOW     Limited Lymphoma Flow Cytometry Panel                           [place] FLOW CYTOMETRY REPORT                                     Specimen Type: Tissue                      SoftFlow Order #: [id number]              INTERPRETATION:                                                                 Targeted and limited flow cytometric analysis of right breast tissue shows  virtually no mature B-cells. The T-cells exhibit a  mildly decreased CD4:CD8     ratio of 1.6:1. Please note that certain hematologic neoplasms including but    not limited to breast implant-associated anaplastic large cell lymphoma,        classical Hodgkin lymphoma, T-cell / histiocyte-rich large B-cell lymphoma,     and nodular lymphocyte predominant Hodgkin lymphoma, as well as                 non-hematolymphoid neoplasms such as carcinomas, cannot be ruled out by flow    cytometry.                                                                      Clinical and morphologic correlation is advised.                                   CLINICAL HISTORY:                                                                Patient with history of BRCA1 mutation and breast cancer, carcinosarcoma of     the uterus, and rectal carcinoma. Prior history of breast reconstruction with   significant asymmetry of her breasts.                                           FLOW CYTOMETRIC ASSESSMENT:                                                     The sample for the current flow cytometry analysis was received on 10/11/2020   and stained on 10/12/2020.                                                      Due to limited sample, viability was not determined, and an abbreviated panel   was tested Flow cytometric analysis was performed on a minimum of 8296   cells  for each antibody.                                                              The lysed right breast  tissue cell suspension was stained with antibodies      directed against the following antigens: CD3, CD4, CD5, CD8, CD10, CD19, CD38,  CD45, sKappa,  sLambda                                                               FLOW CYTOMETRY RESULTS:                                                         Targeted flow cytometric analysis reveals 14% T-lymphocytes with a CD4:CD8      ratio of 1.6:1.                                                                 The remaining events are attributable to other cell types and debris.               This test was developed and its performance characteristics determined by the   [facility] at [address] [place], [place] [address]. It has not been cleared or          approved by the [place] Food and Drug Administration. FDA does not require this      test to go through premarket FDA review. This test is used for clinical         purposes. It should not be regarded as investigational or for research. This    laboratory is certified under the Clinical Laboratory Improvement Amendments    (CLIA) as qualified to perform high complexity clinical laboratory testing.                 Electronicall y signed by:                                                       Ashley Murrain, M.D.,Ph.D.,Attending Hematopathologist                           10/14/2020 4:33 PM                                                               Electronic signature derived from a single controlled access password         Impression:  Kelia is healing as expected with no s/s of infection or hematoma with mild ecchymosis at expected of the L breast. Bolster removed without issue and pt tolerated well.   Reviewed post-op care and restrictions and dsg changes for the nipple.   She will RTC next week for nipple graft suture removal and  f/u.     The risks, benefits and alternatives of the planned course of care have been discussed with the patient and/or her legal representative, all questions have been answered and they agree to proceed.

## 2020-10-18 ENCOUNTER — Other Ambulatory Visit: Payer: Self-pay

## 2020-10-18 NOTE — Telephone Encounter (Signed)
Received call from call center. Transferred call and spoke w patient requesting final path. Reviewed following:    FINAL PATHOLOGIC DIAGNOSIS:   A: Right breast mastectomy skin, excision   -Benign skin and skeletal muscle.   B: Right breast, near total capsulectomy   -Capsular fibrous tissue with refractile foreign material and associated   granulomatous inflammation; benign skeletal muscle with reactive changes.   -Negative for malignancy.   C: Implant, right breast, removal:   -Implant (gross examination only).   D: Left breast reduction skin, excision   -Benign skin and subcutis.   E: Left breast medial wedge, excision   -Benign adipose tissue.   F: Left breast inferior medial wedge, excision   -Benign adipose tissue.   G: Left breast superior medial wedge, excision   -Benign mammary tissue.   H: Left breast superior wedge, excision   -Benign mammary tissue.   I: Left breast lateral wedge, excision   -Benign adipose tissue.     Patient happy to hear there was no malignancy and will see patient as scheduled tomorrow. Let her know a copy of her path report will be given to patient.

## 2020-10-19 ENCOUNTER — Encounter (HOSPITAL_BASED_OUTPATIENT_CLINIC_OR_DEPARTMENT_OTHER): Payer: Self-pay

## 2020-10-19 ENCOUNTER — Ambulatory Visit: Payer: Medicare Other | Attending: Plastic Surgery | Admitting: Plastic Surgery

## 2020-10-19 VITALS — BP 124/65 | HR 86 | Temp 97.7°F | Resp 16 | Ht 69.0 in | Wt 194.9 lb

## 2020-10-19 DIAGNOSIS — Z853 Personal history of malignant neoplasm of breast: Secondary | ICD-10-CM | POA: Insufficient documentation

## 2020-10-19 DIAGNOSIS — Z09 Encounter for follow-up examination after completed treatment for conditions other than malignant neoplasm: Secondary | ICD-10-CM

## 2020-10-19 MED ORDER — CEPHALEXIN 500 MG OR CAPS
500.0000 mg | ORAL_CAPSULE | Freq: Four times a day (QID) | ORAL | 0 refills | Status: AC
Start: 2020-10-19 — End: 2020-10-29

## 2020-10-19 NOTE — Patient Instructions (Signed)
[facility] PHONE LIST FOR PATIENTS  Hours of operation: Monday-Friday 8:00 - 5:00pm, Closed Holidays and Weekends      AFTER HOURS EMERGENCY NUMBER: [phone] Ask for On-Call Surgeon for Surgical Symptoms. As for On-Call Medical Oncologist for Medical Symptoms     Administrative Assistant (Michelle Milo) for Dr. Anne Wallace: [phone]  Administrative Assistant (Samantha Salde) for Nurse Practitioner Vince Genna: [phone]     Nurse Case Managers Avamarie Crossley W., RN @ [phone] ,  Shauna W., RN @ [phone], Bona R., RN [phone]    +++++++++++++++++++++++++++++++++++++++++++++++++++++++++++++++++++++++++++++++++++++++  If you would like to provide us feedback on how we did, you can contact the Patient Experience 'We Listen' Department via telephone, Email, or by mail.    E-mail: [email or web address] Phone: [phone]   Mail:Patient Experience  [name] , [address], [address], [place], [place] [address]      Thank you for the opportunity to care for you during this time.

## 2020-10-19 NOTE — Interdisciplinary (Signed)
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, S/P Lumpectomy and XRT at age 65, Reoccurrence at age 57 S/P Mastectomy with reconand adjuvant chemo- offered endocrine therapy but patient declined    H/O:poorly differentiated carcinosarcoma of the uterus at age 8, s/p TAH/BSO- followed by radiation    H/O: 2020 Rectal cancers/p robotic assisted transanal excision of previous polypectomy site within distal rectum    BRCA 1- recent testing    s/p5/17/2022Left breast free nipple graft technique wise pattern breast reduction ricomplex right breast revision with capsulectomy, readadvancement of pectoralis muscle, implant removal and placement of silicone implant for symmetry, and lipo of axillary tail    Assessment:  Unaccompanied   Presents with drain, per pt report she emptied 41m this morning and 70-860mthe last few days.   This RN emptied 1039mn clinic and managed drain. Bruising is improved, pt keeping up with nipple redressing.     Pt wants before and after photos for records along with op note. Will provide op note and new drain      Medications and allergies updated and reviewed in EPIC    MD Plan:  -Tolerated Suture removal   -drain site re-dressed with biopatch  -Provided op note and new drain log   Aquaphor over nipple daily   -Reviewed drain criteria with pt, she hopes drain will be ready Friday   -RTC in 3 months

## 2020-10-19 NOTE — Progress Notes (Signed)
Reason for Visit  Chief Complaint   Patient presents with    Surgical Follow-up        History of Present Illness:     Lynn Tran is a 65 year old female who is here for Surgical Follow-up      She returns to clinic for post-op f/u s/p Left breast free nipple graft technique wise pattern breast reduction with complex right breast revision with capsulectomy, readadvancement of pectoralis muscle, implant removal and placement of silicone implant for symmetry, and lipo of axillary tail. She denies any fever, erythema or d/c. And her pain is well controlled per her report.     [id number] Breast Infiltrating Ductal Cancer, S/P Lumpectomy and XRT at age 5, Reoccurrence at age 44 S/P Mastectomy with reconand adjuvant chemo- offered endocrine therapy but patient declined    H/O:poorly differentiated carcinosarcoma of the uterus at age 54, s/p TAH/BSO- followed by radiation    H/O: 2020 Rectal cancers/p robotic assisted transanal excision of previous polypectomy site within distal rectum    BRCA1+    Patient Active Problem List   Diagnosis    Gastroesophageal reflux disease    Mild persistent asthma without complication    Contact dermatitis    Herpes simplex type 1 infection    History of torn meniscus of left knee    History of breast cancer    History of uterine cancer    History of cancer    Lesion of skin of foot    History of urticaria    Chronic pain of left knee    History of rectal cancer    FH: melanoma    BRCA1 positive    Encounter to establish care    Adverse effect of bisphosphonates    Vitamin D deficiency    Elevated glucose    Skin lesion    Elevated blood pressure reading without diagnosis of hypertension    Cold sore    Insomnia    Encounter for herb and vitamin supplement management    Alcohol use    Snores    Complex care coordination    Abnormal LFTs    Dyslipidemia    Postnasal drip         Past Medical History:   Diagnosis Date    Asthma     BRCA  gene mutation positive in female 2020    Breast cancer (CMS-HCC) 1984    right, and recurrence 1995, age 52    Ectopic pregnancy     Lymphedema of right arm     NO IV/BP    Molar pregnancy     tx with metotrexate at age 22 yo     PONV (postoperative nausea and vomiting)     Rectal cancer (CMS-HCC) 06/2018    Screening for cervical cancer 09/28/2018    09/25/2018 - s/p hysterectomy for uterine cancer; defer to GynOnc re: if pap needed though unlikely. 10/29/18 - message to Evert Kohl re: confirm no longer needing paps with h/o squamous metaplasia on cervical pathology 10/31/18 - no longer needs paps confirmed.    Uterine cancer (CMS-HCC)     carcinosarcoma         Past Surgical History:   Procedure Laterality Date    h/o  S/p robotic assisted transanal excision of previous polypectomy site within the distal rectum  05/2018    TOTAL ABDOMINAL HYSTERECTOMY W/ BILATERAL SALPINGOOPHORECTOMY  1998    MASTECTOMY Right 1995    breast cancer recurrence  BREAST LUMPECTOMY Right 1984    H/o ectopic pregnancy      TONSILLECTOMY AND ADENOIDECTOMY           Allergies   Allergen Reactions    Adhesive Tape Rash    Dmdm Hydantoin Hives    Neosporin [Bacitracin-Polymyxin B] Rash    Ancef [Cefazolin] Other     Delayed urticaria; significant uncertainly as may have been due to concurrently administered opioid    Codeine Nausea and Vomiting    Morphine Nausea and Vomiting    [Other] Other     Positive patch testing to:  Mercapto mix  quaternium 15  Imidazolidinyl urea  Neomycin  parthenolide  formaldehyde         Current Outpatient Medications   Medication Sig Dispense Refill    albuterol 108 (90 Base) MCG/ACT inhaler Inhale 2 puffs by mouth every 6 hours as needed for Wheezing or Shortness of Breath. 8.5 g 11    Biotin 1 MG CAPS       budesonide-formoterol (SYMBICORT) 80-4.5 MCG/ACT inhaler inhale 2 puffs by mouth twice a day 30.6 g 3    cephALEXin (KEFLEX) 500 MG capsule Take 1 capsule (500 mg) by  mouth 4 times daily for 10 days. 40 capsule 0    Cholecalciferol (VITAMIN D3) 1000 units tablet 4,000 Int'l Units.      CINNAMON PO       clobetasol propionate (TEMOVATE) 0.05 % cream Apply 1 Application topically 2 times daily. Apply to affected area twice a day for recurrent contact dermatitis. 30 g 1    Coenzyme Q10 (CO Q 10 PO)       COLLAGEN PO       Cyanocobalamin 1000 MCG/ML KIT Through weight clinic      diclofenac (VOLTAREN) 1 % gel Apply 4 g topically 4 times daily. Use the plastic dose guide found inside the box to measure the dose. 1 Tube 1    docusate sodium (COLACE) 250 MG capsule Take 1 capsule (250 mg) by mouth 2 times daily. 60 capsule 0    Glucosamine-MSM-Hyaluronic Acd (JOINT HEALTH PO)       HYDROcodone-acetaminophen (NORCO) 5-325 MG tablet Take 1 tablet by mouth every 6 hours as needed for Moderate Pain (Pain Score 4-6). 25 tablet 0    ibuprofen (MOTRIN) 800 MG tablet Take 1 tablet (800 mg) by mouth every 8 hours as needed for Mild Pain (Pain Score 1-3). Refilled require visit. 30 tablet 0    ipratropium (ATROVENT) 0.03 % nasal spray Spray 2 sprays into each nostril At bedtime as needed for Rhinitis. 1 bottle 11    Milk Thistle 250 MG CAPS       omeprazole (PRILOSEC) 20 MG capsule Take 1 capsule (20 mg) by mouth daily. 90 capsule 3    ondansetron (ZOFRAN) 4 MG tablet Take 1 tablet (4 mg) by mouth every 8 hours as needed for Nausea/Vomiting. 25 tablet 0    sulfamethoxazole-trimethoprim (BACTRIM DS, SEPTRA DS) 800-160 MG tablet Take 1 tablet by mouth 2 times daily. 14 tablet 0     No current facility-administered medications for this visit.         Social History     Socioeconomic History    Marital status: Significant Other   Tobacco Use    Smoking status: Never Smoker    Smokeless tobacco: Never Used   Substance and Sexual Activity    Alcohol use: Yes     Alcohol/week: 14.0 standard drinks  Types: 14 Standard drinks or equivalent per week    Drug use: No    Sexual  activity: Yes     Partners: Male   Social History Narrative    Pt born and raised in [place]. She was living in [place] when her mother's health started to deteriorate. She moved back to [facility] to take care of her mother, who passed 05/2020. Pt will move back to [facility] 02/2021     Social History     Tobacco Use   Smoking Status Never Smoker   Smokeless Tobacco Never Used     Social History     Substance and Sexual Activity   Alcohol Use Yes    Alcohol/week: 14.0 standard drinks    Types: 14 Standard drinks or equivalent per week     Social History     Substance and Sexual Activity   Drug Use No         Family History   Problem Relation Name Age of Onset    Breast Cancer Mother  34        Recurrence 56    Melanoma Cancer Mother  38    Heart Attack Father  20    Breast Cancer Paternal Grandfather          Diagnosed in 17s    Colon Cancer Maternal Uncle          59s    Other Maternal Uncle          Cushing's    Breast Cancer Paternal Aunt          70s     PE  BP 124/65 (BP Location: Left arm, BP Patient Position: Sitting, BP cuff size: Regular)    Pulse 86    Temp 97.7 F (36.5 C) (Temporal)    Resp 16    Ht '5\' 9"'  (1.753 m)    Wt 88.4 kg (194 lb 14.2 oz)    SpO2 95%    BMI 28.78 kg/m   Breasts - well healing breasts ; no evidence of complication at this time.  Much improved shape and form.    Medical Decision Making  Test Results:  Results for orders placed or performed during the hospital encounter of 10/11/20   Limited Lymphoma Flow Cytometry Panel   Result Value Ref Range    Flow PDF Report FOR PDF RESULTS,CLICK "VIEW IMAGE" BELOW     Limited Lymphoma Flow Cytometry Panel                           [facility] FLOW CYTOMETRY REPORT                                     Specimen Type: Tissue                      SoftFlow Order #: [id number]              INTERPRETATION:                                                                 Targeted and limited flow cytometric analysis of  right breast tissue shows      virtually no  mature B-cells. The T-cells exhibit a mildly decreased CD4:CD8     ratio of 1.6:1. Please note that certain hematologic neoplasms including but    not limited to breast implant-associated anaplastic large cell lymphoma,        classical Hodgkin lymphoma, T-cell / histiocyte-rich large B-cell lymphoma,     and nodular lymphocyte predominant Hodgkin lymphoma, as well as                 non-hematolymphoid neoplasms such as carcinomas, cannot be ruled out by flow    cytometry.                                                                      Clinical and morphologic correlation is advised.                                   CLINICAL HISTORY:                                                                Patient with history of BRCA1 mutation and breast cancer, carcinosarcoma of     the uterus, and rectal carcinoma. Prior history of breast reconstruction with   significant asymmetry of her breasts.                                           FLOW CYTOMETRIC ASSESSMENT:                                                     The sample for the current flow cytometry analysis was received on 10/11/2020   and stained on 10/12/2020.                                                      Due to limited sample, viability was not determined, and an abbreviated panel   was tested Flow cytometric analysis was performed on a minimum of 8296   cells  for each antibody.                                                              The lysed right breast  tissue cell suspension was stained with antibodies      directed against the following antigens: CD3,  CD4, CD5, CD8, CD10, CD19, CD38,  CD45, sKappa, sLambda                                                               FLOW CYTOMETRY RESULTS:                                                         Targeted flow cytometric analysis reveals 14% T-lymphocytes with a CD4:CD8      ratio of 1.6:1.                                                                 The remaining events are  attributable to other cell types and debris.              This test was developed and its performance characteristics determined by the   [facility] at [address], [address] [place], [place] [address]. It has not been cleared or          approved by the [place] Food and Drug Administration. FDA does not require this      test to go through premarket FDA review. This test is used for clinical         purposes. It should not be regarded as investigational or for research. This    laboratory is certified under the Clinical Laboratory Improvement Amendments    (CLIA) as qualified to perform high complexity clinical laboratory testing.                 Electronicall y signed by:                                                       Ashley Murrain, M.D.,Ph.D.,Attending Hematopathologist                           10/14/2020 4:33 PM                                                               Electronic signature derived from a single controlled access password         Impression:  Lynn Tran is healing as expected with no s/s of infection or hematoma with mild ecchymosis at expected of the L breast. Sutures removed without issue and pt tolerated well.   She will f/u with RNs for drain care and removal and she will RTC for  scar check in 3 months.

## 2020-10-20 NOTE — Telephone Encounter (Signed)
Pt called in stating, she thinks her drain is ready for removal .   Confirmed with pt that 10/19/20 total was 29ml . She agreed it was and states output this morning was 58ml.    Reviewed criteria and pt understands drain may not be appropriate for removal tomorrow but will empty today 5/26, tomorrow morning 5/27 and again late afternoon and report output to staff

## 2020-10-21 NOTE — Telephone Encounter (Signed)
Called patient for drain update. Reports the following:    5/27 62ml plus 77ml that's in bulb.   5/26 60ml  5/25 71ml    Based on output, let her know it's not appropriate for removal. Let her know if she was still wanting on getting it removed I could still see her today but advised her she would most likely need a few aspirations afterwards. Patient decided she would rather leave drain in over the weekend. Reviewed when her drain dressing starts to not adhere, can slowly remove dressing and replace with a bandaid. Scheduled patient for a nurse visit on Tue @ 415 pm for drain removal. Let her know if drain is not ready by then to notify us. Agreed with plan. Routed to front desk to schedule appt.

## 2020-10-24 ENCOUNTER — Other Ambulatory Visit: Payer: Self-pay

## 2020-10-25 ENCOUNTER — Telehealth (HOSPITAL_BASED_OUTPATIENT_CLINIC_OR_DEPARTMENT_OTHER): Payer: Self-pay

## 2020-10-25 ENCOUNTER — Ambulatory Visit (HOSPITAL_BASED_OUTPATIENT_CLINIC_OR_DEPARTMENT_OTHER): Payer: Medicare Other

## 2020-10-25 NOTE — Telephone Encounter (Signed)
Patient called and left voicemail to discuss drain status.  Patient has nurse visit today for drain removal.     Drain had 30 cc on Sunday, 37 cc yesterday. This morning emptied 19 cc. Discussed that drain is not yet appropriate for removal and she should contact us later in the week with an update.     Per patient report, drain site without evidence of infection and she is keeping it covered with clean dressings.     Will cancel nurse visit today.

## 2020-10-27 ENCOUNTER — Other Ambulatory Visit: Payer: Self-pay

## 2020-10-27 NOTE — Telephone Encounter (Signed)
Pt called in reporting the following drain output:    5/31:69ml   6/1: 62ml   6/2: 57ml     Pt will empty again this evening. A tentative appt made for 10/28/20 @ 1330 for pt If drain appropriate. Will route to front desk     Pt needs a follow up appt with Dr. Juleen China 12/2020. As it relates to accepted out of state insurance for revision surgery, referred her to [employer].

## 2020-10-28 ENCOUNTER — Ambulatory Visit: Payer: Medicare Other

## 2020-10-28 VITALS — BP 134/87 | HR 92 | Temp 98.0°F | Resp 16 | Ht 69.0 in | Wt 194.9 lb

## 2020-10-28 DIAGNOSIS — Z853 Personal history of malignant neoplasm of breast: Secondary | ICD-10-CM

## 2020-10-28 NOTE — Patient Instructions (Signed)
-  NO Shower for 24 HOURS   -Remove bandage and shower. Continue showering daily with soap and water and remember to pat dry before returning to compression vest or other garment   - Inf you experience Fever, malaise, purulent drainage, or other sx of infection call [phone]

## 2020-10-28 NOTE — Interdisciplinary (Signed)
Follow Up Visit with RN     [id number] Breast Infiltrating Ductal Cancer, S/P Lumpectomy and XRT at age 65, Reoccurrence at age 50 S/P Mastectomy with reconand adjuvant chemo- offered endocrine therapy but patient declined    H/O:poorly differentiated carcinosarcoma of the uterus at age 64, s/p TAH/BSO- followed by radiation    H/O: 2020 Rectal cancers/p robotic assisted transanal excision of previous polypectomy site within distal rectum    BRCA 1- recent testing    s/p5/17/2022Left breast free nipple graft technique wise pattern breast reduction ricomplex right breast revision with capsulectomy, readadvancement of pectoralis muscle, implant removal and placement of silicone implant for symmetry, and lipo of axillary tail    Assessment:  Unaccompanied   Reports emptying the following   5/29: 36m   5/30:357m 5/31:3459m 6/1: 66m71m6/2: 30ml61m/3: 13.5 (2.5ml e75mied in clinic)    Reviewed drain criteria to pt and explained seroma risk with drain removal today. She verbalized understanding and states she is familiar with seroma management and agreed to maintain compression ( sports bra and surgical vest)    Left nipple graft is healing well, pt continues Aquaphor daily   Reviewed exercise and activity restrictions     Outcome/ Plan:  -Tolerated RIGHT drain removal, tips intact   -Bandage applied per protocol  -NO Shower for 24 HOURS   -Remove bandage and shower. Continue showering daily with soap and water and remember to pat dry before returning to compression vest or other garment   -RTC  as scheduled   -Thoroughly reviewed instructions prior to discharge.

## 2020-11-23 ENCOUNTER — Encounter (INDEPENDENT_AMBULATORY_CARE_PROVIDER_SITE_OTHER): Payer: Self-pay

## 2020-12-28 ENCOUNTER — Ambulatory Visit (INDEPENDENT_AMBULATORY_CARE_PROVIDER_SITE_OTHER): Admitting: Internal Medicine

## 2021-01-03 ENCOUNTER — Telehealth (INDEPENDENT_AMBULATORY_CARE_PROVIDER_SITE_OTHER): Payer: Self-pay

## 2021-01-11 ENCOUNTER — Ambulatory Visit (INDEPENDENT_AMBULATORY_CARE_PROVIDER_SITE_OTHER): Admitting: Internal Medicine

## 2021-01-16 NOTE — Patient Instructions (Signed)
[facility] PHONE LIST FOR PATIENTS  Hours of operation: Monday-Friday 8:00 - 5:00pm, Closed Holidays and Weekends      AFTER HOURS EMERGENCY NUMBER: [phone] Ask for On-Call Surgeon for Surgical Symptoms. As for On-Call Medical Oncologist for Medical Symptoms     Admin Assistant (Michelle Milo) for Dr. Anne Wallace: [phone]  Admin Assistant (Samantha Salde) for Nurse Practitioner Vince Genna: [phone]     Nurse Case Managers Isaly Fasching W., RN @ [phone] ,  Shauna W., RN @ [phone], Bona R., RN [phone]    +++++++++++++++++++++++++++++++++++++++++++++++++++++++++++++++++++++++++++++++++++++++  If you would like to provide us feedback on how we did, you can contact the Patient Experience 'We Listen' Department via telephone, Email, or by mail.    E-mail: [email or web address] Phone: [phone]      Thank you for the opportunity to care for you during this time.

## 2021-01-17 ENCOUNTER — Other Ambulatory Visit: Payer: Self-pay

## 2021-01-18 ENCOUNTER — Ambulatory Visit: Payer: Medicare Other | Attending: Plastic Surgery | Admitting: Plastic Surgery

## 2021-01-18 VITALS — BP 123/69 | HR 85 | Temp 97.3°F | Resp 16 | Ht 69.0 in | Wt 195.5 lb

## 2021-01-18 DIAGNOSIS — Z9011 Acquired absence of right breast and nipple: Secondary | ICD-10-CM | POA: Insufficient documentation

## 2021-01-18 DIAGNOSIS — Z853 Personal history of malignant neoplasm of breast: Secondary | ICD-10-CM | POA: Insufficient documentation

## 2021-01-18 NOTE — Progress Notes (Signed)
Reason for Visit  Chief Complaint   Patient presents with    Follow Up        History of Present Illness:     Lynn Tran is a 65 year old female who is here for Follow Up    She returns to clinic for scar and symmetry check s/p Left breast free nipple graft technique wise pattern breast reduction,complex right breast revision with capsulectomy, readadvancement of pectoralis muscle, implant removal and placement of silicone implant for symmetry, and lipo of axillary tail in May. She notes she has healed well with minor "spitting suture". She is much happier with her L breast, but feels her R breast has fallen down and would like to know what can be done.     F/X:[id number] Breast Infiltrating Ductal Cancer, S/P Lumpectomy and XRT at age 66, Reoccurrence at age 21 S/P Mastectomy with reconand adjuvant chemo- offered endocrine therapy but patient declined    H/O:poorly differentiated carcinosarcoma of the uterus at age 77, s/p TAH/BSO- followed by radiation    H/O: 2020 Rectal cancers/p robotic assisted transanal excision of previous polypectomy site within distal rectum    BRCA 1- recent testing    Patient Active Problem List   Diagnosis    Gastroesophageal reflux disease    Mild persistent asthma without complication    Contact dermatitis    Herpes simplex type 1 infection    History of torn meniscus of left knee    History of breast cancer    History of uterine cancer    History of cancer    Lesion of skin of foot    History of urticaria    Chronic pain of left knee    History of rectal cancer    FH: melanoma    BRCA1 positive    Encounter to establish care    Adverse effect of bisphosphonates    Vitamin D deficiency    Elevated glucose    Skin lesion    Elevated blood pressure reading without diagnosis of hypertension    Cold sore    Insomnia    Encounter for herb and vitamin supplement management    Alcohol use    Snores    Complex care coordination    Abnormal LFTs     Dyslipidemia    Postnasal drip         Past Medical History:   Diagnosis Date    Asthma     BRCA gene mutation positive in female 2020    Breast cancer (CMS-HCC) 1984    right, and recurrence 1995, age 20    Ectopic pregnancy     Lymphedema of right arm     NO IV/BP    Molar pregnancy     tx with metotrexate at age 72 yo     PONV (postoperative nausea and vomiting)     Rectal cancer (CMS-HCC) 06/2018    Screening for cervical cancer 09/28/2018    09/25/2018 - s/p hysterectomy for uterine cancer; defer to GynOnc re: if pap needed though unlikely. 10/29/18 - message to Evert Kohl re: confirm no longer needing paps with h/o squamous metaplasia on cervical pathology 10/31/18 - no longer needs paps confirmed.    Uterine cancer (CMS-HCC)     carcinosarcoma         Past Surgical History:   Procedure Laterality Date    h/o  S/p robotic assisted transanal excision of previous polypectomy site within the distal rectum  05/2018    TOTAL  ABDOMINAL HYSTERECTOMY W/ BILATERAL SALPINGOOPHORECTOMY  1998    MASTECTOMY Right 1995    breast cancer recurrence    BREAST LUMPECTOMY Right 1984    H/o ectopic pregnancy      TONSILLECTOMY AND ADENOIDECTOMY           Allergies   Allergen Reactions    Adhesive Tape Rash    Dmdm Hydantoin Hives    Neosporin [Bacitracin-Polymyxin B] Rash    Ancef [Cefazolin] Other     Delayed urticaria; significant uncertainly as may have been due to concurrently administered opioid    Codeine Nausea and Vomiting    Morphine Nausea and Vomiting    [Other] Other     Positive patch testing to:  Mercapto mix  quaternium 15  Imidazolidinyl urea  Neomycin  parthenolide  formaldehyde         Current Outpatient Medications   Medication Sig Dispense Refill    albuterol 108 (90 Base) MCG/ACT inhaler Inhale 2 puffs by mouth every 6 hours as needed for Wheezing or Shortness of Breath. 8.5 g 11    Biotin 1 MG CAPS       budesonide-formoterol (SYMBICORT) 80-4.5 MCG/ACT inhaler inhale 2  puffs by mouth twice a day 30.6 g 3    Cholecalciferol (VITAMIN D3) 1000 units tablet 4,000 Int'l Units.      CINNAMON PO       clobetasol propionate (TEMOVATE) 0.05 % cream Apply 1 Application topically 2 times daily. Apply to affected area twice a day for recurrent contact dermatitis. 30 g 1    Coenzyme Q10 (CO Q 10 PO)       COLLAGEN PO       Cyanocobalamin 1000 MCG/ML KIT Through weight clinic      diclofenac (VOLTAREN) 1 % gel Apply 4 g topically 4 times daily. Use the plastic dose guide found inside the box to measure the dose. 1 Tube 1    docusate sodium (COLACE) 250 MG capsule Take 1 capsule (250 mg) by mouth 2 times daily. 60 capsule 0    Glucosamine-MSM-Hyaluronic Acd (JOINT HEALTH PO)       HYDROcodone-acetaminophen (NORCO) 5-325 MG tablet Take 1 tablet by mouth every 6 hours as needed for Moderate Pain (Pain Score 4-6). 25 tablet 0    ibuprofen (MOTRIN) 800 MG tablet Take 1 tablet (800 mg) by mouth every 8 hours as needed for Mild Pain (Pain Score 1-3). Refilled require visit. 30 tablet 0    ipratropium (ATROVENT) 0.03 % nasal spray Spray 2 sprays into each nostril At bedtime as needed for Rhinitis. 1 bottle 11    Milk Thistle 250 MG CAPS       omeprazole (PRILOSEC) 20 MG capsule Take 1 capsule (20 mg) by mouth daily. 90 capsule 3    ondansetron (ZOFRAN) 4 MG tablet Take 1 tablet (4 mg) by mouth every 8 hours as needed for Nausea/Vomiting. 25 tablet 0     No current facility-administered medications for this visit.         Social History     Socioeconomic History    Marital status: Significant Other   Tobacco Use    Smoking status: Never Smoker    Smokeless tobacco: Never Used   Substance and Sexual Activity    Alcohol use: Yes     Alcohol/week: 14.0 standard drinks     Types: 14 Standard drinks or equivalent per week    Drug use: No    Sexual activity: Yes  Partners: Male   Social History Narrative    Pt born and raised in [place]. She was living in [place] when her mother's health  started to deteriorate. She moved back to [facility] to take care of her mother, who passed 05/2020. Pt will move back to [facility] 02/2021     Social History     Tobacco Use   Smoking Status Never Smoker   Smokeless Tobacco Never Used     Social History     Substance and Sexual Activity   Alcohol Use Yes    Alcohol/week: 14.0 standard drinks    Types: 14 Standard drinks or equivalent per week     Social History     Substance and Sexual Activity   Drug Use No         Family History   Problem Relation Name Age of Onset    Breast Cancer Mother  75        Recurrence 17    Melanoma Cancer Mother  46    Heart Attack Father  64    Breast Cancer Paternal Grandfather          Diagnosed in 22s    Colon Cancer Maternal Uncle          29s    Other Maternal Uncle          Cushing's    Breast Cancer Paternal Aunt          70s       Review of Systems    Pertinent items are noted in HPI.    Physical Exam  Patient is a 65 year old female who appeared alert, cooperative  BP 123/69 (BP Location: Left arm, BP Patient Position: Sitting, BP cuff size: Regular)    Pulse 85    Temp 97.3 F (36.3 C) (Temporal)    Resp 16    Ht '5\' 9"'  (1.753 m)    Wt 88.7 kg (195 lb 8.8 oz)    SpO2 97%    BMI 28.88 kg/m   Body mass index is 28.88 kg/m.  Breast:  Scars have healed well and her L breast position and placement is good. Her R breast has healed well but has dropped and is sitting lower than her L.   Lungs clear  CV RRR    Medical Decision Making  Impression:    Jolynn came to me with a severe deformity and there was a great deal of pocket work that was required to open the right side up and allow the implant to sit properly.  However, in doing so the estimation of where the implant would sit has been slightly off as she is healed and it is no too low.  This can be corrected with capsulloraphy and possible alloderm placement.  We will plan surgery for when she is back in town. She would also like her retained nipple now removed as it is  bothersome to her.     SRF SRX 450-595cc sizer and implant    She would like to have the surgery in the spring since she is moving in October.     R breast revision with implant exchange and nipple excision and possible placement of alloderm.  We discussed the use of alloderm.  It is ADM, radiated decellularized dermis and used commonly in many forms of reconstruction.    Surgery will be scheduled. Risks include hematoma, infection, pain, scar, recurrence, contour problems, need for further surgery, etc.

## 2021-01-18 NOTE — Interdisciplinary (Signed)
Follow Up Visit with Dr Juleen China for scar check  H/O:1984Right Breast Infiltrating Ductal Cancer, S/P Lumpectomy and XRT at age 65, Reoccurrence at age 29 S/P Mastectomy with reconand adjuvant chemo- offered endocrine therapy but patient declined    H/O:poorly differentiated carcinosarcoma of the uterus at age 27, s/p TAH/BSO- followed by radiation    H/O: 2020 Rectal cancers/p robotic assisted transanal excision of previous polypectomy site within distal rectum    BRCA 1- recent testing    s/p5/17/2022Left breast free nipple graft technique wise pattern breast reduction, complex right breast revision with capsulectomy, readadvancement of pectoralis muscle, implant removal and placement of silicone implant for symmetry, and lipo of axillary tail    Assessment:  Doing well over all, discussed managed for spitting suture    Wants to discuss right lift and nipple excision for symmetry     Medications and allergies updated and reviewed in EPIC    MD Plan:  -RTC in Spring for Pre-op and surgery   -Plan for R breast revision with implant exchange and nipple excision and possible placement of alloderm

## 2021-05-30 ENCOUNTER — Telehealth (HOSPITAL_BASED_OUTPATIENT_CLINIC_OR_DEPARTMENT_OTHER): Payer: Self-pay

## 2021-05-30 DIAGNOSIS — Z853 Personal history of malignant neoplasm of breast: Secondary | ICD-10-CM

## 2021-05-30 NOTE — Telephone Encounter (Signed)
Patient called and wanted to schedule next surgery.       Informed patient I have no surgery notes or consent. Patient stated she was told to call beginning of 2023.      Will fwd to team to determine surgery and will bring back in for pre op.

## 2021-06-08 NOTE — Telephone Encounter (Signed)
Patient called and locked in surgery for 09/05/21.

## 2021-06-14 NOTE — Addendum Note (Signed)
Addended by: Oscar La on: 06/14/2021 02:11 PM     Modules accepted: Orders

## 2021-06-14 NOTE — Telephone Encounter (Signed)
Patient coming out in April fro surgery with Dr Juleen China - Patient is requesting mammogram to be done prior to surgery.    Pre op 08/30/21 and surgery 09/05/21      Will send to team to place orders.

## 2021-06-14 NOTE — Addendum Note (Signed)
Addended byBarnet Pall on: 06/14/2021 02:12 PM     Modules accepted: Orders

## 2021-06-14 NOTE — Telephone Encounter (Signed)
Mammogram scheduled for 08/30/20 at 1pm  With 12:45 check in.

## 2021-07-16 ENCOUNTER — Encounter (HOSPITAL_BASED_OUTPATIENT_CLINIC_OR_DEPARTMENT_OTHER): Payer: Self-pay | Admitting: Plastic Surgery

## 2021-08-04 ENCOUNTER — Encounter: Payer: Self-pay | Admitting: Family Medicine

## 2021-08-04 DIAGNOSIS — Z780 Asymptomatic menopausal state: Secondary | ICD-10-CM

## 2021-08-07 ENCOUNTER — Other Ambulatory Visit (HOSPITAL_BASED_OUTPATIENT_CLINIC_OR_DEPARTMENT_OTHER): Payer: Self-pay | Admitting: Plastic Surgery

## 2021-08-07 ENCOUNTER — Encounter (HOSPITAL_BASED_OUTPATIENT_CLINIC_OR_DEPARTMENT_OTHER): Payer: Self-pay | Admitting: Plastic Surgery

## 2021-08-07 LAB — EMMI , PATIENT SATISFACTION: HOSPITAL DISCHARGE EXPECTATIONS: EMMI Video Order Number: 10600918949

## 2021-08-07 LAB — EMMI , ANESTHESIA (ADULT): EMMI Video Order Number: 18242245514

## 2021-08-07 LAB — EMMI , BREAST RECONSTRUCTION: EMMI Video Order Number: 14927829508

## 2021-08-07 LAB — EMMI,  PAIN MANAGEMENT: ACUTE IN HOSPITAL: EMMI Video Order Number: 19733093579

## 2021-08-22 ENCOUNTER — Other Ambulatory Visit: Payer: Self-pay

## 2021-08-23 ENCOUNTER — Other Ambulatory Visit: Payer: Self-pay

## 2021-08-24 ENCOUNTER — Ambulatory Visit (INDEPENDENT_AMBULATORY_CARE_PROVIDER_SITE_OTHER): Payer: Medicare Other

## 2021-08-24 ENCOUNTER — Encounter (INDEPENDENT_AMBULATORY_CARE_PROVIDER_SITE_OTHER): Payer: Self-pay

## 2021-08-24 DIAGNOSIS — Z01818 Encounter for other preprocedural examination: Secondary | ICD-10-CM

## 2021-08-24 MED ORDER — INV VALACYCLOVIR (MSK-DA01) 500 MG TABLET (2021-6570)(~~LOC~~ ASCC 20-21): ORAL_TABLET | ORAL | Status: AC

## 2021-08-24 NOTE — Interdisciplinary (Signed)
Anesthesia Preparedness Clinic [facility]) RN PHONE CALL NOTE    Phone call to patient from [facility] RN today.     Confirmed medical history & that medications listed in Epic are accurate and up to date.     Patient scheduled for surgery on 09/05/2021 at [facility]    Confirmed preoperative instructions with patient including NPO instructions.     Planning your surgery information sent to patient via MyChart     States difficult IV access    Able to walk 35mns    Please draw A1c on DOS, over 35 no A1c   No questions or concerns at this time.

## 2021-08-24 NOTE — Patient Instructions (Signed)
Your surgery is currently scheduled at [facility] on 09/05/2021  The scheduler will be contacting you with the check in time                                                                                 [address], [place], [place] [address]  Check in and Operating room is down the elevator on the lower level (LL)    Cascade structure, Microbiologist structure, or Dance movement psychotherapist parking (7am-5pm at [employer] entrance and [employer] Social research officer, government; 5am-5pm at [employer] by Emergency room/Labor and Delivery) for same cost as self-parking.  [email or web address]       QUESTIONS    If you have any questions between now and the day of your surgery, please do not hesitate to call:     [facility]: [place] [phone]      DAY OF SURGERY ARRIVAL TIME:    On the day of your Surgery/Procedure, please arrive at the time provided by the surgeon's clinic.  [facility] surgery Pre-op team will contact you the day before surgery.  Patients are limited to 2 visitors over the age of 66        Universal:        OK to take medications as scheduled with a small sip of water on the morning of surgery: your usual medications    PLEASE HOLD ALL NSAIDS (non-steroidal anti-inflammatory drugs) SUCH AS advil, aleve, motrin, ibuprofen, relafen, lodine, feldene, Diclofenac, voltaren, indomethacin, naproxen, celebrex, Mobic 7 days before surgery.       Please hold vitamins, supplements, CO-Q10, Glucosamine, herbs & fish oil 7 days before surgery. Vitamin C, D are ok to take.     It is OK to take acetaminophen (Tylenol) for pain around the time of surgery unless you have liver disease.      AFTER YOUR VISIT WITH [place], IF YOU START TAKING A NEW MEDICATION BEFORE SURGERY, PLEASE CALL us TO MAKE SURE IT IS SAFE TO TAKE &  WILL NOT AFFECT YOUR SURGERY.         OSA INSTRUCTIONS:     If you use a CPAP machine, please bring the entire machine, including mask and tubing, with you on the day of surgery.    TO DO LIST:          EATING/DRINKING     DO NOT EAT OR DRINK ANYTHING AFTER MIDNIGHT ON THE DAY OF SURGERY    Preparing for your Surgery:     Please wear clean loose-fitting clothes and leave valuables at home   Do not shave or remove body hair. Facial shaving is permitted. If you are having head/face surgery, ask your surgeon's office whether you can shave.  Bring a picture ID and your insurance card, and be prepared to pay your deductible or co-insurance by cash, check, or credit card when you arrive.   All patients [facility] go home after their surgery, Please make sure to arrange for an adult to drive you home. You CANNOT use UBER or LYFT.  If you do not have a ride, your surgery may be cancelled.       On The Day of Your Surgery:      Check in at the location mentioned above   COVID testing may be done on arrival to the Pre-op or Procedural areas according to the current CDPH mandate.   If you are a woman of child bearing age, please note that you may be asked to give a urine sample upon check-in  You will meet your anesthesia and surgery teams in the preoperative holding area before surgery.   Once surgery is over, you will wake up in the recovery room.  An adult chaperone will need to stay with you for the first 24 hours after surgery.   Visitor policy during the GPQDI-26 pandemic is subject to change.  No more that two visitors are allowed per patient. Current visitor policy can be found at [email or web address]      A video about what to expect for the day of surgery can be found here:    [email or web address]  Or by searching "You-tube" for [place] before surgery and [place] after surgery     Your medical records are available to you at [email or web address] click sign up now.

## 2021-08-25 ENCOUNTER — Other Ambulatory Visit: Payer: Self-pay

## 2021-08-29 ENCOUNTER — Other Ambulatory Visit: Payer: Self-pay

## 2021-08-30 ENCOUNTER — Ambulatory Visit: Payer: Medicare Other | Attending: Plastic Surgery | Admitting: Plastic Surgery

## 2021-08-30 ENCOUNTER — Ambulatory Visit (HOSPITAL_BASED_OUTPATIENT_CLINIC_OR_DEPARTMENT_OTHER)
Admission: RE | Admit: 2021-08-30 | Discharge: 2021-08-30 | Disposition: A | Discharge status: Home Health | Payer: Medicare Other

## 2021-08-30 VITALS — BP 139/80 | HR 83 | Temp 97.6°F | Resp 16 | Ht 69.0 in | Wt 198.9 lb

## 2021-08-30 DIAGNOSIS — N6489 Other specified disorders of breast: Secondary | ICD-10-CM | POA: Insufficient documentation

## 2021-08-30 DIAGNOSIS — Z1231 Encounter for screening mammogram for malignant neoplasm of breast: Secondary | ICD-10-CM | POA: Insufficient documentation

## 2021-08-30 DIAGNOSIS — Z9011 Acquired absence of right breast and nipple: Secondary | ICD-10-CM

## 2021-08-30 DIAGNOSIS — Z9889 Other specified postprocedural states: Secondary | ICD-10-CM

## 2021-08-30 DIAGNOSIS — Z9882 Breast implant status: Secondary | ICD-10-CM

## 2021-08-30 DIAGNOSIS — Z853 Personal history of malignant neoplasm of breast: Secondary | ICD-10-CM

## 2021-08-30 NOTE — Interdisciplinary (Unsigned)
Follow Up Visit with Dr. Juleen China for Pre-Op     B/F:[id number] Breast Infiltrating Ductal Cancer, S/P Lumpectomy and XRT at age 66, Reoccurrence at age 51 S/P Mastectomy with reconand adjuvant chemo- offered endocrine therapy but patient declined    H/O:poorly differentiated carcinosarcoma of the uterus at age 85, s/p TAH/BSO- followed by radiation    H/O: 2020 Rectal cancers/p robotic assisted transanal excision of previous polypectomy site within distal rectum    BRCA 1- recent testing    5/17/2022Left breast free nipple graft technique wise pattern breast reduction,complex right breast revision with capsulectomy, readadvancement of pectoralis muscle, implant removal and placement of silicone implant for symmetry, and lipo of axillary tail    Assessment:  Last visit 12/2020. Discussed right breast revision.     DOS 4/11 Right breast revision with implant exchange and right nipple excision with possible placement alloderm .      Mammogram prior to this visit.    AUGMENTED SCREENING MAMMOGRAM WITH DBT FINDINGS:  There are scattered areas of fibroglandular densities.    There has been a previous right mastectomy with implant reconstruction. No suspicious findings.    There are post-reduction changes in the left breast.  No masses, asymmetries, or suspicious calcifications are seen.    IMPRESSION / RECOMMENDATION:  Post-reduction changes in the left breast is benign.    Normal interval follow-up mammogram in 1 year is recommended.    ASSESSMENT:  BI-RADS Category 2:  Benign Finding(s)    Medications and allergies updated and reviewed in EPIC    Plan:  -consented for surgery, pre-op teaching by this RN  -RTC post-op

## 2021-08-30 NOTE — Patient Instructions (Signed)
[facility] PHONE LIST FOR PATIENTS  Hours of operation: Monday-Friday 8:00 - 5:00pm, Closed Holidays and Weekends      AFTER HOURS EMERGENCY NUMBER: [phone] Ask for On-Call Surgeon for Surgical Symptoms. As for On-Call Medical Oncologist for Medical Symptoms     Admin Assistant: Michelle Milo for Dr. Anne Wallace: [phone]     Admin Assistant: Vanessa Lopez for Nurse Practitioner Vince Genna:[phone]    Nurse Case Managers for Dr. Anne Wallace and Nurse Practitioner Vince Genna: Leonides Minder W., RN @ [phone]  Janeen W., RN @ [phone]  Bona R., RN [phone]    Social Worker: Laurie Knight, LCSW Phone: [phone] or Betty A. Mc Cullough, LCSW, OSW-C Phone [phone]  [facility] support groups (open to any patients in [place] area:) http://[email or web address]/Support_Home.[email or web address]   American Cancer Society Breast Pel[place] support groups: https://www.cancer.org/treatment/support-programs-and-services.html    Stage IV Zoom Breast Cancer Support group meets twice monthly; sign up with the following link: [email or web address]?[email or web address]%3Dlist     Please note that due to recent changes in state laws, there may be times when your imaging reports are being released through Mychart before your provider team is able to review them. Please know we will review them by next business day and will contact you with results and recommendations.      VISIT THIS LINK FOR MOR EINFORMATION REGARDING YOUR BREAST CANCER DIAGNOSIS [email or web address]/info/general_health/breast_care/guide_breast_ca_dx_tx.pdf   ___________________________________________________________________   Information Desk: [phone]   General information, directions, phone numbers, registration and other available services     Financial Counselors: [phone] at [facility]  [phone] or [phone]     FOR INFORMATION REGARDING ADVANCE DIRECTIVES PLEASE VISIT   [email or web address]/[email or web address]-Pamphlet-[email or web address]-English.pdf  ______________________________________________________________________  Tell Us How We Did During Your Consultation/Follow Up Visit...  Your feedback goes a long way and makes a difference. If you would like to provide us feedback on how we did via telephone or  Email  E-mail: [email or web address] Phone: [phone]      Thank you for the opportunity to care for you during this time.

## 2021-08-30 NOTE — Progress Notes (Signed)
Reason for Visit  Chief Complaint   Patient presents with    Pre op visit        History of Present Illness:     Lynn Tran is a 66 year old female who is here for Pre op visit    She returns to clinic for clinical breast check and f/u. She was last seen in August of last year to discuss a R breast revision. She has a hx of L breast breast free nipple graft technique wise pattern breast reduction,complex right breast revision with capsulectomy, readadvancement of pectoralis muscle, implant removal and placement of silicone implant for symmetry, and lipo of axillary tail. . She is much happier with her L breast, but feels her R breast has fallen down and is now ready for revision.    [id number] Breast Infiltrating Ductal Cancer, S/P Lumpectomy and XRT at age 49, Reoccurrence at age 52 S/P Mastectomy with reconand adjuvant chemo- offered endocrine therapy but patient declined    H/O:poorly differentiated carcinosarcoma of the uterus at age 80, s/p TAH/BSO- followed by radiation    H/O: 2020 Rectal cancers/p robotic assisted transanal excision of previous polypectomy site within distal rectum    BRCA 1- recent testing    Patient Active Problem List   Diagnosis    Gastroesophageal reflux disease    Mild persistent asthma without complication    Contact dermatitis    Herpes simplex type 1 infection    History of torn meniscus of left knee    History of breast cancer    History of uterine cancer    History of cancer    Lesion of skin of foot    History of urticaria    Chronic pain of left knee    History of rectal cancer    FH: melanoma    BRCA1 positive    Encounter to establish care    Adverse effect of bisphosphonates    Vitamin D deficiency    Elevated glucose    Skin lesion    Elevated blood pressure reading without diagnosis of hypertension    Cold sore    Insomnia    Encounter for herb and vitamin supplement management    Alcohol use    Snores    Complex care  coordination    Abnormal LFTs    Dyslipidemia    Postnasal drip         Past Medical History:   Diagnosis Date    Asthma     environmental/allergic triggers: cats, fog, smoke    BRCA gene mutation positive in female 2020    Breast cancer (CMS-HCC) 1984    right, and recurrence 1995, age 41    Ectopic pregnancy     Lymphedema of right arm     NO IV/BP    Molar pregnancy     tx with metotrexate at age 37 yo     PONV (postoperative nausea and vomiting)     Rectal cancer (CMS-HCC) 06/2018    Screening for cervical cancer 09/28/2018    09/25/2018 - s/p hysterectomy for uterine cancer; defer to GynOnc re: if pap needed though unlikely. 10/29/18 - message to Evert Kohl re: confirm no longer needing paps with h/o squamous metaplasia on cervical pathology 10/31/18 - no longer needs paps confirmed.    Uterine cancer (CMS-HCC)     carcinosarcoma         Past Surgical History:   Procedure Laterality Date    h/o  S/p robotic assisted  transanal excision of previous polypectomy site within the distal rectum  05/2018    TOTAL ABDOMINAL HYSTERECTOMY W/ BILATERAL SALPINGOOPHORECTOMY  1998    MASTECTOMY Right 1995    breast cancer recurrence    BREAST LUMPECTOMY Right 1984    H/o ectopic pregnancy      TONSILLECTOMY AND ADENOIDECTOMY           Allergies   Allergen Reactions    Adhesive Tape Rash    Dmdm Hydantoin Hives    Neosporin [Bacitracin-Polymyxin B] Rash    Ancef [Cefazolin] Other     Delayed urticaria; significant uncertainly as may have been due to concurrently administered opioid    Codeine Nausea and Vomiting    Morphine Nausea and Vomiting    [Other] Other     Positive patch testing to:  Mercapto mix  quaternium 15  Imidazolidinyl urea  Neomycin  parthenolide  formaldehyde         Current Outpatient Medications   Medication Sig Dispense Refill    albuterol 108 (90 Base) MCG/ACT inhaler Inhale 2 puffs by mouth every 6 hours as needed for Wheezing or Shortness of Breath. 8.5 g 11    Biotin 1  MG CAPS       budesonide-formoterol (SYMBICORT) 80-4.5 MCG/ACT inhaler inhale 2 puffs by mouth twice a day 30.6 g 3    Cholecalciferol (VITAMIN D3) 1000 units tablet 4,000 Int'l Units.      clobetasol propionate (TEMOVATE) 0.05 % cream Apply 1 Application topically 2 times daily. Apply to affected area twice a day for recurrent contact dermatitis. 30 g 1    Coenzyme Q10 (CO Q 10 PO)       COLLAGEN PO       diclofenac (VOLTAREN) 1 % gel Apply 4 g topically 4 times daily. Use the plastic dose guide found inside the box to measure the dose. 1 Tube 1    docusate sodium (COLACE) 250 MG capsule Take 1 capsule (250 mg) by mouth 2 times daily. 60 capsule 0    Glucosamine-MSM-Hyaluronic Acd (JOINT HEALTH PO)       ibuprofen (MOTRIN) 800 MG tablet Take 1 tablet (800 mg) by mouth every 8 hours as needed for Mild Pain (Pain Score 1-3). Refilled require visit. 30 tablet 0    Milk Thistle 250 MG CAPS       omeprazole (PRILOSEC) 20 MG capsule Take 1 capsule (20 mg) by mouth daily. 90 capsule 3     No current facility-administered medications for this visit.         Social History     Socioeconomic History    Marital status: Significant Other   Tobacco Use    Smoking status: Never    Smokeless tobacco: Never   Substance and Sexual Activity    Alcohol use: Yes     Alcohol/week: 14.0 standard drinks of alcohol     Types: 14 Standard drinks or equivalent per week    Drug use: No    Sexual activity: Yes     Partners: Male   Social History Narrative    Pt born and raised in [place]. She was living in [place] when her mother's health started to deteriorate. She moved back to [facility] to take care of her mother, who passed 05/2020. Pt will move back to [facility] 02/2021     Social History     Tobacco Use   Smoking Status Never   Smokeless Tobacco Never   Vaping Use   Vaping  Status Not on file     Social History     Substance and Sexual Activity   Alcohol Use Yes    Alcohol/week: 14.0 standard drinks of alcohol    Types: 14  Standard drinks or equivalent per week     Social History     Substance and Sexual Activity   Drug Use No         Family History   Problem Relation Name Age of Onset    Breast Cancer Mother  65        Recurrence 16    Melanoma Cancer Mother  26    Heart Attack Father  92    Breast Cancer Paternal Grandfather          Diagnosed in 44s    Colon Cancer Maternal Uncle          31s    Other Maternal Uncle          Cushing's    Breast Cancer Paternal Aunt          70s     REVIEW OF SYSTEMS:  Constitutional:No weight gain or loss  HEENT: No visual changes, nasal congestion, throat pain  Pulmonary: No cough or sputum, No SOB  Cardiac: No chest pain or palpitations  GI:No nausea, vomiting, diarrhea, constipation  GU:No hematuria, dysuria, oliguria  GYN: No abnormal bleeding or pelvic pain  Ext: No swelling or trauma  Skin: No worrisome or new lesions  Neuro: No dizziness or fainting  Psych: No new mood swings, depression or sleep problems    Physical Exam  Patient is a 66 year old female who appeared alert, cooperative  BP 139/80 (BP Location: Left arm, BP Patient Position: Sitting, BP cuff size: Large)    Pulse 83    Temp 97.6 F (36.4 C) (Temporal)    Resp 16    Ht '5\' 9"'  (1.753 m)    Wt 90.2 kg (198 lb 13.7 oz)    SpO2 97%    BMI 29.37 kg/m   Body mass index is 29.37 kg/m.  Breast:  Scars have healed well and her L breast position and placement is good. Her R breast has healed well but has dropped and is sitting lower than her L and is grossly asymmetry; no mass or adenoapthy  Lungs clear  CV RRR    Medical Decision Making  Impression:    Lynn Tran came to me with a severe deformity and there was a great deal of pocket work that was required to open the right side up and allow the implant to sit properly.  However, in doing so the estimation of where the implant would sit has been slightly off as she is healed and it is no much too low.  This can be corrected with capsulloraphy and possible alloderm placement.   She would also like her retained nipple now removed as it is bothersome to her. She notes that she feels the implant needs to be a little smaller as well.     SRF SRX 450-595cc sizer and implant    R breast revision with implant exchange and nipple excision and possible placement of alloderm.     We discussed the use of alloderm.  It is ADM, radiated decellularized dermis and used commonly in many forms of reconstruction.    Surgery will be scheduled. Risks include hematoma, infection, pain, scar, recurrence, contour problems, need for further surgery, etc.     I performed greater than 50% of  this encounter, including chart review, discussion with the team and time with the patient.  Charna Archer, NP, assisted with documentation in the note as we spoke to the patient together.

## 2021-09-01 ENCOUNTER — Other Ambulatory Visit: Payer: Self-pay

## 2021-09-04 ENCOUNTER — Encounter (HOSPITAL_BASED_OUTPATIENT_CLINIC_OR_DEPARTMENT_OTHER): Payer: Self-pay | Admitting: Plastic Surgery

## 2021-09-04 NOTE — Anesthesia Preprocedure Evaluation (Signed)
ANESTHESIA PRE-OPERATIVE EVALUATION    Patient Information    Name: Lynn Tran    MRN: [id number]    DOB: 17-Feb-1956    Age: 66 year old    Sex: female  Procedure(s):  Right Breast Revision with breast implant exchange,  excision of nipple areolar complex, possible placement of alloderm      Pre-op Vitals:   There were no vitals taken for this visit.        Primary language spoken:  English    ROS/Medical History:      History of Present Illness: All: neosporin, cefazolin, codeine, motphine, adhesive tape,     Received ciprofloxacin 5/22     General:  positive for Obesity,  Concerns about memory comment: plan for TIVA.   Cardiovascular:  negative cardio ROS  > 4 mets, can go up 3 flights stairs    Denies CP, CHF MI   Anesthesia History:  history of difficult intubation (glidescope used in past),  PONV,  5/22 Breast reduction, M4G3 view    2/20 [facility]A [facility], glidescope used Pulmonary:   asthma,     Neuro/Psych:   negative neuro/psych ROS   Hematology/Oncology:   history of cancer (Breast, uterine, ),      GI/Hepatic:  GERD,   Infectious Disease:  negative for infectious disease     Renal:  negative renal ROS   Endocrine/Other:  negative endo/other ROS     Pregnancy History:   Pediatrics:         Pre Anesthesia Testing (PCC/CPC) notes/comments:                 Physical Exam    Airway:    Inter-inciser distance > 4 cm  Prognanth Able    Mallampati: III  Neck ROM: full  TM distance: 5-6 cm  Short thick neck: No          Cardiovascular:  - cardiovascular exam normal   Rhythm: regular   Rate: normal         Pulmonary:  - pulmonary exam normal      breath sounds clear to auscultation        Neuro/Neck/Skeletal/Skin:  - Neck/Neuro/Skeletal/Skin exam normal          Dental:  - normal exam    Abdominal:   - normal exam     General: normal weight     Additional Clinical Notes:               Last  OSA (STOP BANG) Score:  No data recorded    Last OSA  (STOP) Score for   Has a physician diagnosed you with sleep apnea?: No  Do  you use a CPAP at home?: No  Do you snore loudly (loud enough to be heard through a closed door)?: 1  Do you often feel tired, fatigued or sleepy during the day?: 0  Has anyone observed that you stop breathing while you are sleeping?: 0  Have you ever been treated for high blood pressure?: 0  OSA total score (A score of 2 or more is high risk. Offer patient sleep study.): 1                   Past Medical History:   Diagnosis Date   . Asthma     environmental/allergic triggers: cats, fog, smoke   . BRCA gene mutation positive in female 2020   . Breast cancer (CMS-HCC) 1984    right, and recurrence 56, age 63   .  Ectopic pregnancy    . Lymphedema of right arm     NO IV/BP   . Molar pregnancy     tx with metotrexate at age 39 yo    . PONV (postoperative nausea and vomiting)    . Rectal cancer (CMS-HCC) 06/2018   . Screening for cervical cancer 09/28/2018    09/25/2018 - s/p hysterectomy for uterine cancer; defer to GynOnc re: if pap needed though unlikely. 10/29/18 - message to Evert Kohl re: confirm no longer needing paps with h/o squamous metaplasia on cervical pathology 10/31/18 - no longer needs paps confirmed.   Marland Kitchen Uterine cancer (CMS-HCC)     carcinosarcoma     Past Surgical History:   Procedure Laterality Date   . h/o  S/p robotic assisted transanal excision of previous polypectomy site within the distal rectum  05/2018   . TOTAL ABDOMINAL HYSTERECTOMY W/ BILATERAL SALPINGOOPHORECTOMY  1998   . MASTECTOMY Right 1995    breast cancer recurrence   . BREAST LUMPECTOMY Right 1984   . H/o ectopic pregnancy     . TONSILLECTOMY AND ADENOIDECTOMY       Social History     Socioeconomic History   . Marital status: Significant Other   Tobacco Use   . Smoking status: Never   . Smokeless tobacco: Never   Substance and Sexual Activity   . Alcohol use: Yes     Alcohol/week: 14.0 standard drinks of alcohol     Types: 14 Standard drinks or equivalent per week   . Drug use: No   . Sexual activity: Yes     Partners: Male    Social History Narrative    Pt born and raised in [place]. She was living in [place] when her mother's health started to deteriorate. She moved back to [facility] to take care of her mother, who passed 05/2020. Pt will move back to [facility] 02/2021     Alcohol Use: Unknown (09/18/2017)    AUDIT-C    . Frequency of Alcohol Consumption: 4 or more times a week    . Average Number of Drinks: 1 or 2    . Frequency of Binge Drinking: Not on file       No current facility-administered medications for this encounter.     Current Outpatient Medications   Medication Sig Dispense Refill   . albuterol 108 (90 Base) MCG/ACT inhaler Inhale 2 puffs by mouth every 6 hours as needed for Wheezing or Shortness of Breath. 8.5 g 11   . Biotin 1 MG CAPS      . budesonide-formoterol (SYMBICORT) 80-4.5 MCG/ACT inhaler inhale 2 puffs by mouth twice a day 30.6 g 3   . Cholecalciferol (VITAMIN D3) 1000 units tablet 4,000 Int'l Units.     . clobetasol propionate (TEMOVATE) 0.05 % cream Apply 1 Application topically 2 times daily. Apply to affected area twice a day for recurrent contact dermatitis. 30 g 1   . Coenzyme Q10 (CO Q 10 PO)      . COLLAGEN PO      . diclofenac (VOLTAREN) 1 % gel Apply 4 g topically 4 times daily. Use the plastic dose guide found inside the box to measure the dose. 1 Tube 1   . docusate sodium (COLACE) 250 MG capsule Take 1 capsule (250 mg) by mouth 2 times daily. 60 capsule 0   . Glucosamine-MSM-Hyaluronic Acd (JOINT HEALTH PO)      . ibuprofen (MOTRIN) 800 MG tablet Take 1 tablet (800 mg)  by mouth every 8 hours as needed for Mild Pain (Pain Score 1-3). Refilled require visit. 30 tablet 0   . Milk Thistle 250 MG CAPS      . omeprazole (PRILOSEC) 20 MG capsule Take 1 capsule (20 mg) by mouth daily. 90 capsule 3     Allergies   Allergen Reactions   . Adhesive Tape Rash   . Dmdm Hydantoin Hives   . Neosporin [Bacitracin-Polymyxin B] Rash   . Ancef [Cefazolin] Other     Delayed urticaria; significant uncertainly as may have been  due to concurrently administered opioid   . Codeine Nausea and Vomiting   . Morphine Nausea and Vomiting   . [Other] Other     Positive patch testing to:  Mercapto mix  quaternium 15  Imidazolidinyl urea  Neomycin  parthenolide  formaldehyde       Labs and Other Data  Lab Results   Component Value Date    NA 144 11/19/2019    K 4.7 11/19/2019    CL 108 (H) 11/19/2019    BICARB 21 11/19/2019    BUN 16 11/19/2019    CREAT 0.88 11/19/2019    GLU 88 11/19/2019    [place] 9.5 11/19/2019     Lab Results   Component Value Date    AST 28 11/19/2019    ALT 27 11/19/2019    GGT 21 11/19/2019    ALK 73 11/19/2019    TP 6.5 11/19/2019    ALB 4.4 11/19/2019    TBILI 0.5 11/19/2019     Lab Results   Component Value Date    WBC 4.0 11/19/2019    RBC 4.16 11/19/2019    HGB 13.5 11/19/2019    HCT 39.3 11/19/2019    MCV 95 11/19/2019    MCHC 34.4 11/19/2019    RDW 13.1 11/19/2019    PLT 298 11/19/2019    SEG 44 11/19/2019    LYMPHS 40 11/19/2019    MONOS 12 11/19/2019    EOS 3 11/19/2019    BASOS 1 11/19/2019     No results found for: INR, PTT  No results found for: ARTPH, ARTPO2, ARTPCO2    Anesthesia Plan:  Risks and Benefits of Anesthesia  I have personally performed an appropriate pre-anesthesia physical exam of the patient (including heart, lungs, and airway) prior to the anesthetic and reviewed the pertinent medical history, drug and allergy history, laboratory and imaging studies and consultations.   I have determined that the patient has had adequate assessment and testing.  I have validated the documentation of these elements of the patient exam and/or have made necessary changes to reflect my own observations during my pre-anesthesia exam.  Anesthetic techniques, invasive monitors, anesthetic drugs for induction, maintenance and post-operative analgesia, risks and alternatives have been explained to the patient and/or patient's representatives.    I have prescribed the anesthetic plan:         Planned anesthesia method:  General         ASA 3 (Severe systemic disease)     Potential anesthesia problems identified and risks including but not limited to the following were discussed with patient and/or patient's representative: Adverse or allergic drug reaction, Recall, Nerve injury, Dental injury or sore throat, Ocular injury, Injury to brain, heart and other organs, Death and Patient declined further  discussion of risks of anesthesia    No Beta Blocker Indicated: Patient not on beta blockers    Planned monitoring method: Routine monitoring  Comments: (Consent  reviewed, risks discussed, patient understands and accepts including heart problems, heart attack, strokes, allergic reactions, breathing problems, oral/eye injuries, nerve damage, brain damage, awareness/recall, organ damage and death. Questions answered)    Informed Consent:  Anesthetic plan and risks discussed with Patient.

## 2021-09-04 NOTE — H&P (Signed)
HISTORY & PHYSICAL - INTERVAL ASSESSMENT    **ONLY TO BE USED IN ADDITION TO A HISTORY & PHYSICALKassiah Mccrory  [id number]      Margy Sumler is a 66 year old female presents for scheduled surgery today of R breast revision with implant exchange and nipple excision and possible placement of alloderm.     Denies fever, chills, cough, ST, flu-like symptoms, recent infections, CP, SOB.  Allergies adhesive tape, neosporin, Ancef? - delayed urticaria, codeine, morphine  Overall feeling well. No major complaints today. Ready to proceed.     History taken from EMR, adapted from surgeon's office note dated 08/30/21, edited as needed by me:     She returns to clinic for clinical breast check and f/u. She was last seen in August of last year to discuss a R breast revision. She has a hx of L breast breast free nipple graft technique wise pattern breast reduction,complex right breast revision with capsulectomy, readadvancement of pectoralis muscle, implant removal and placement of silicone implant for symmetry, and lipo of axillary tail. . She is much happier with her L breast, but feels her R breast has fallen down and is now ready for revision.    [id number] Breast Infiltrating Ductal Cancer, S/P Lumpectomy and XRT at age 30, Reoccurrence at age 61 S/P Mastectomy with reconand adjuvant chemo- offered endocrine therapy but patient declined    H/O:poorly differentiated carcinosarcoma of the uterus at age 72, s/p TAH/BSO- followed by radiation    H/O: 2020 Rectal cancers/p robotic assisted transanal excision of previous polypectomy site within distal rectum    BRCA 1- recent testing      SRF SRX 450-595cc sizer and implant   (currently Allergan SRX 950 cc silicone gel implant from 09/2020)  Current Medical Status:  Unchanged    Medications / Allergies:  Unchanged    Review of Systems:  Unchanged    Physical Examination:  I have examined the patient today.  Unchanged    Laboratory or Clinical  Data:  Unchanged    Modifications of Initial Care Plan:  Unchanged        Doyce Loose, [place] 09/05/2021    pager [phone]

## 2021-09-05 ENCOUNTER — Ambulatory Visit (HOSPITAL_BASED_OUTPATIENT_CLINIC_OR_DEPARTMENT_OTHER): Payer: Medicare Other | Admitting: Anesthesiology

## 2021-09-05 ENCOUNTER — Ambulatory Visit
Admission: RE | Admit: 2021-09-05 | Discharge: 2021-09-05 | Disposition: A | Discharge status: Home / Self Care | Payer: Medicare Other | Attending: Plastic Surgery | Admitting: Plastic Surgery

## 2021-09-05 ENCOUNTER — Telehealth (INDEPENDENT_AMBULATORY_CARE_PROVIDER_SITE_OTHER): Payer: Self-pay | Admitting: Optometrist

## 2021-09-05 ENCOUNTER — Ambulatory Visit (HOSPITAL_BASED_OUTPATIENT_CLINIC_OR_DEPARTMENT_OTHER): Payer: Medicare Other | Admitting: Student in an Organized Health Care Education/Training Program

## 2021-09-05 ENCOUNTER — Other Ambulatory Visit: Payer: Self-pay

## 2021-09-05 ENCOUNTER — Encounter (HOSPITAL_COMMUNITY): Payer: Self-pay

## 2021-09-05 ENCOUNTER — Encounter (HOSPITAL_BASED_OUTPATIENT_CLINIC_OR_DEPARTMENT_OTHER): Admission: RE | Disposition: A | Discharge status: Home Health | Payer: Self-pay | Attending: Plastic Surgery

## 2021-09-05 DIAGNOSIS — G8918 Other acute postprocedural pain: Secondary | ICD-10-CM | POA: Insufficient documentation

## 2021-09-05 DIAGNOSIS — Z9011 Acquired absence of right breast and nipple: Secondary | ICD-10-CM | POA: Insufficient documentation

## 2021-09-05 DIAGNOSIS — Z8542 Personal history of malignant neoplasm of other parts of uterus: Secondary | ICD-10-CM

## 2021-09-05 DIAGNOSIS — Z1509 Genetic susceptibility to other malignant neoplasm: Secondary | ICD-10-CM

## 2021-09-05 DIAGNOSIS — H3560 Retinal hemorrhage, unspecified eye: Secondary | ICD-10-CM

## 2021-09-05 DIAGNOSIS — E669 Obesity, unspecified: Secondary | ICD-10-CM

## 2021-09-05 DIAGNOSIS — Z853 Personal history of malignant neoplasm of breast: Secondary | ICD-10-CM

## 2021-09-05 DIAGNOSIS — L910 Hypertrophic scar: Secondary | ICD-10-CM

## 2021-09-05 DIAGNOSIS — J45909 Unspecified asthma, uncomplicated: Secondary | ICD-10-CM

## 2021-09-05 DIAGNOSIS — N651 Disproportion of reconstructed breast: Secondary | ICD-10-CM | POA: Insufficient documentation

## 2021-09-05 DIAGNOSIS — M954 Acquired deformity of chest and rib: Secondary | ICD-10-CM | POA: Insufficient documentation

## 2021-09-05 DIAGNOSIS — N6489 Other specified disorders of breast: Secondary | ICD-10-CM

## 2021-09-05 SURGERY — REVISION, RECONSTRUCTION, BREAST
Anesthesia: General | Site: Chest | Laterality: Right | Wound class: Class I (Clean)

## 2021-09-05 MED ORDER — OXYCODONE HCL 5 MG OR TABS
5.0000 mg | ORAL_TABLET | ORAL | Status: DC | PRN
Start: 2021-09-05 — End: 2021-09-05
  Administered 2021-09-05: 5 mg via ORAL
  Filled 2021-09-05: qty 1

## 2021-09-05 MED ORDER — CIPROFLOXACIN IN D5W 400 MG/200ML IV SOLN
INTRAVENOUS | Status: AC
Start: 2021-09-05 — End: 2021-09-05
  Filled 2021-09-05: qty 200

## 2021-09-05 MED ORDER — HYDROMORPHONE HCL 1 MG/ML IJ SOLN
0.5000 mg | INTRAMUSCULAR | Status: DC | PRN
Start: 2021-09-05 — End: 2021-09-05
  Administered 2021-09-05: 0.5 mg via INTRAVENOUS
  Filled 2021-09-05: qty 0.5

## 2021-09-05 MED ORDER — MIDAZOLAM HCL 2 MG/2ML IJ SOLN
INTRAMUSCULAR | Status: AC
Start: 2021-09-05 — End: 2021-09-05
  Filled 2021-09-05: qty 2

## 2021-09-05 MED ORDER — ROPIVACAINE HCL 5 MG/ML IJ SOLN
INTRAMUSCULAR | Status: DC | PRN
Start: 2021-09-05 — End: 2021-09-05
  Administered 2021-09-05: 10 mL via PERINEURAL

## 2021-09-05 MED ORDER — PROPOFOL 1000 MG/100ML IV EMUL
INTRAVENOUS | Status: DC | PRN
Start: 2021-09-05 — End: 2021-09-05
  Administered 2021-09-05: 50 ug/kg/min via INTRAVENOUS
  Administered 2021-09-05: 25 ug/kg/min via INTRAVENOUS

## 2021-09-05 MED ORDER — HYDROCODONE-ACETAMINOPHEN 5-325 MG OR TABS
1.0000 | ORAL_TABLET | Freq: Four times a day (QID) | ORAL | 0 refills | Status: DC | PRN
Start: 2021-09-05 — End: 2021-09-11
  Filled 2021-09-05: qty 30, 7d supply, fill #0

## 2021-09-05 MED ORDER — ACETAMINOPHEN 325 MG PO TABS
975.0000 mg | ORAL_TABLET | Freq: Once | ORAL | Status: DC
Start: 2021-09-05 — End: 2021-09-05

## 2021-09-05 MED ORDER — NALOXONE HCL 0.4 MG/ML IJ SOLN
0.1000 mg | INTRAMUSCULAR | Status: DC | PRN
Start: 2021-09-05 — End: 2021-09-05

## 2021-09-05 MED ORDER — MIDAZOLAM HCL 2 MG/2ML IJ SOLN
INTRAMUSCULAR | Status: DC | PRN
Start: 2021-09-05 — End: 2021-09-05
  Administered 2021-09-05: 2 mg via INTRAVENOUS

## 2021-09-05 MED ORDER — LACTATED RINGERS IV SOLN
INTRAVENOUS | Status: DC
Start: 2021-09-05 — End: 2021-09-05

## 2021-09-05 MED ORDER — DEXAMETHASONE SODIUM PHOSPHATE 4 MG/ML IJ SOLN (CUSTOM)
INTRAMUSCULAR | Status: DC | PRN
Start: 2021-09-05 — End: 2021-09-05
  Administered 2021-09-05: 6 mg via INTRAVENOUS

## 2021-09-05 MED ORDER — DOCUSATE SODIUM 250 MG OR CAPS
250.0000 mg | ORAL_CAPSULE | Freq: Two times a day (BID) | ORAL | 0 refills | Status: AC
Start: 2021-09-05 — End: ?
  Filled 2021-09-05: qty 30, 15d supply, fill #0

## 2021-09-05 MED ORDER — PROPOFOL 200 MG/20ML IV EMUL
INTRAVENOUS | Status: DC | PRN
Start: 2021-09-05 — End: 2021-09-05
  Administered 2021-09-05: 150 mg via INTRAVENOUS

## 2021-09-05 MED ORDER — SUGAMMADEX SODIUM 200 MG/2ML IV SOLN
INTRAVENOUS | Status: AC
Start: 2021-09-05 — End: 2021-09-05
  Filled 2021-09-05: qty 2

## 2021-09-05 MED ORDER — FENTANYL CITRATE (PF) 250 MCG/5ML IJ SOLN
INTRAMUSCULAR | Status: DC | PRN
Start: 2021-09-05 — End: 2021-09-05
  Administered 2021-09-05 (×2): 25 ug via INTRAVENOUS
  Administered 2021-09-05: 50 ug via INTRAVENOUS

## 2021-09-05 MED ORDER — FENTANYL CITRATE (PF) 100 MCG/2ML IJ SOLN
INTRAMUSCULAR | Status: AC
Start: 2021-09-05 — End: 2021-09-05
  Filled 2021-09-05: qty 2

## 2021-09-05 MED ORDER — LACTATED RINGERS IV SOLN
INTRAVENOUS | Status: DC | PRN
Start: 2021-09-05 — End: 2021-09-05

## 2021-09-05 MED ORDER — DIPHENHYDRAMINE HCL 50 MG/ML IJ SOLN
12.5000 mg | Freq: Once | INTRAMUSCULAR | Status: DC | PRN
Start: 2021-09-05 — End: 2021-09-05

## 2021-09-05 MED ORDER — TRANEXAMIC ACID 1000 MG/10ML IV SOLN
Status: DC | PRN
Start: 2021-09-05 — End: 2021-09-05
  Administered 2021-09-05: 1000 mg

## 2021-09-05 MED ORDER — AMISULPRIDE (ANTIEMETIC) 10 MG/4ML IV SOLN
10.0000 mg | Freq: Once | INTRAVENOUS | Status: DC | PRN
Start: 2021-09-05 — End: 2021-09-05

## 2021-09-05 MED ORDER — LIDOCAINE HCL 2 % IJ SOLN WRAPPED RECORD
INTRAMUSCULAR | Status: DC | PRN
Start: 2021-09-05 — End: 2021-09-05
  Administered 2021-09-05: 50 mg via INTRATHECAL

## 2021-09-05 MED ORDER — ROCURONIUM BROMIDE 100 MG/10ML IV SOLN
INTRAVENOUS | Status: DC | PRN
Start: 2021-09-05 — End: 2021-09-05
  Administered 2021-09-05: 20 mg via INTRAVENOUS
  Administered 2021-09-05: 50 mg via INTRAVENOUS

## 2021-09-05 MED ORDER — MENTHOL 3 MG MT LOZG
1.0000 | LOZENGE | OROMUCOSAL | Status: DC | PRN
Start: 2021-09-05 — End: 2021-09-05

## 2021-09-05 MED ORDER — SODIUM CHLORIDE 0.9 % IV SOLN
12.5000 mg | Freq: Four times a day (QID) | INTRAVENOUS | Status: DC | PRN
Start: 2021-09-05 — End: 2021-09-05

## 2021-09-05 MED ORDER — TRANEXAMIC ACID 1000 MG/10ML IV SOLN
INTRAVENOUS | Status: AC
Start: 2021-09-05 — End: 2021-09-05
  Filled 2021-09-05: qty 10

## 2021-09-05 MED ORDER — MEPERIDINE HCL 25 MG/ML IJ SOLN
12.5000 mg | INTRAMUSCULAR | Status: DC | PRN
Start: 2021-09-05 — End: 2021-09-05

## 2021-09-05 MED ORDER — LACTATED RINGERS IV SOLN
Freq: Once | INTRAVENOUS | Status: DC | PRN
Start: 2021-09-05 — End: 2021-09-05

## 2021-09-05 MED ORDER — ONDANSETRON HCL 4 MG/2ML IV SOLN
INTRAMUSCULAR | Status: DC | PRN
Start: 2021-09-05 — End: 2021-09-05
  Administered 2021-09-05 (×2): 4 mg via INTRAVENOUS

## 2021-09-05 MED ORDER — SULFAMETHOXAZOLE-TRIMETHOPRIM 800-160 MG OR TABS
1.0000 | ORAL_TABLET | Freq: Two times a day (BID) | ORAL | 0 refills | Status: DC
Start: 2021-09-05 — End: 2021-09-08
  Filled 2021-09-05: qty 14, 7d supply, fill #0

## 2021-09-05 MED ORDER — SUGAMMADEX SODIUM 500 MG/5ML IV SOLN
INTRAVENOUS | Status: DC | PRN
Start: 2021-09-05 — End: 2021-09-05
  Administered 2021-09-05: 200 mg via INTRAVENOUS

## 2021-09-05 MED ORDER — LIDOCAINE HCL (PF) 1 % IJ SOLN
0.1000 mL | Freq: Once | INTRAMUSCULAR | Status: DC | PRN
Start: 2021-09-05 — End: 2021-09-05

## 2021-09-05 SURGICAL SUPPLY — 75 items
ALLODERM 6 X 16 THICK (Skin substitutes/matrix) ×3 IMPLANT
APPLIER CLIP MEDIUM LIGACLIP (Staplers and staple reloads) IMPLANT
APPLIER LIGACLIP MCA W/30 MEDIUM TI CLIPS (Staplers and staple reloads) IMPLANT
BANDAGE COBAN 4 STERILE LATEX FREE (Dressings/packing) ×3 IMPLANT
BANDAGE, GAUZE ROLL, KERLIX 4.5" X 4.1YD (Dressings/packing) ×6 IMPLANT
BLANKET BAIR HUGGER UNDERBODY (Misc Medical Supply) ×3 IMPLANT
BREAST IMPLANT  INSPIRA SRX 580CC (Tissue Expanders/breast implants) ×3 IMPLANT
CAUTERY TIP EDGE BLADE ELECTRODE 2.5", HEX LOCKING (Cautery) ×3 IMPLANT
CAUTERY TIP EDGE BLADE ELECTRODE 2.75", INSULATED (Cautery) ×3 IMPLANT
CONTAINER PRECISION SPECIMEN, 4OZ- STERILE (Misc Medical Supply) ×6 IMPLANT
CONTAINER SPECIMEN 83 OZ (Misc Medical Supply) ×3 IMPLANT
DRAIN RELIAVAC FLAT 10MM X 20CM 0070440 (Lines/Drains) IMPLANT
DRAIN RELIAVAC FLAT 7MM X 20CM 0070430 (Lines/Drains) ×3 IMPLANT
DRAIN SUCTION EVAC RELIAVAC 100CC (Lines/Drains) ×3 IMPLANT
DRAPE LITHOTOMY LEGGING W/CUFF 31" X 48" (Drapes/towels) ×3 IMPLANT
DRAPE ORTHO U 76" X 120" (Drapes/towels) ×6 IMPLANT
DRAPE POUCH STERI INST 7X11 (Drapes/towels) ×3
DRAPE POUCH STERI INST 7X11 1018 (Drapes/towels) ×2 IMPLANT
DRAPE SURGICAL MAGNETIC 10" X 16" (Drapes/towels) ×3 IMPLANT
DRESSING SPONGE CURITY 4X4 16 PLY (Dressings/packing) ×6 IMPLANT
DRESSING TEGADERM I.V. ADVANCED 4X4 1688 (Dressings/packing) ×3 IMPLANT
DRESSING XEROFORM OCCLUSIVE GAUZE ROLL 4" X 9' (SUB) (Dressings/packing) ×3 IMPLANT
DRESSING XEROFORM OCCLUSIVE GAUZE STRIP OVERWRAP 5" X 9" (Dressings/packing) ×6 IMPLANT
FUNNEL KELLER 2 (Procedure Packs/kits) ×2 IMPLANT
FUNNEL KELLER 2 BX/5 (Procedure Packs/kits) ×1
GAUZE, SPONGE CURITY, 8 PLY, STERILE, 2"X2",2'S (Dressings/packing) IMPLANT
GLOVE BIOGEL LF SIZE 8 INDICATOR (Gloves/Gowns) ×3
GLOVE BIOGEL PI INDICATOR SIZE 6.5 (Gloves/Gowns) ×9 IMPLANT
GLOVE BIOGEL PI INDICATOR SIZE 7 (Gloves/Gowns) ×3 IMPLANT
GLOVE BIOGEL PI INDICATOR SIZE 7.5 (Gloves/Gowns) ×3 IMPLANT
GLOVE BIOGEL PI LF SIZE 8 INDICATOR (Gloves/Gowns) ×2 IMPLANT
GLOVE SURGEON BIOGEL SIZE 7 (Gloves/Gowns) ×3 IMPLANT
GLOVE SURGEON BIOGEL SIZE 7.5 (Gloves/Gowns) ×3 IMPLANT
GLOVE SURGICAL BIOGEL SIZE 6.5 (Gloves/Gowns) ×3 IMPLANT
GOWN SIRUS SM BLUE, AAMI LVL 3 (Gloves/Gowns) ×3 IMPLANT
GOWN STANDARD ULTRA LG BLUE (Gloves/Gowns) ×3 IMPLANT
GOWN SURGICAL ULTRA XL BLUE, AAMI LVL 3 (Gloves/Gowns) ×3 IMPLANT
KIT FILL BREAST EXPANDER (Kits/Sets/Trays) IMPLANT
MARKER DEVON UTILITY WITH LABELS- STERILE (Misc Medical Supply) ×3 IMPLANT
NEEDLE PROTECT 18G X 1.5" (Needles/punch/cannula/biopsy) ×3 IMPLANT
NEEDLE PROTECT 22G X 1.5" (Needles/punch/cannula/biopsy) ×3 IMPLANT
NEEDLE PROTECT 25G X 1.5" (Needles/punch/cannula/biopsy) ×6 IMPLANT
PAD GROUND VALLEYLAB REM ADULT E7507 (Cautery) ×3 IMPLANT
PIN SAFETY 2 STERILE (Patient Care Supply) ×3 IMPLANT
PREP TRAY WET SKIN SCRUB KENDALL (Prep Solutions) ×3 IMPLANT
PROTECTOR ULNAR NERVE PAD, YELLOW (Patient Care Supply) ×6 IMPLANT
SET INTERPULSE HANDPIECE W/ HF TIP AND SUCTION (Tubing/Suction) ×3 IMPLANT
SET NEEDLE INFUSION 21GA (Needles/punch/cannula/biopsy) IMPLANT
SIZER INSPIRA EXTRA-FULL PROFILE 580CC (Sizers) ×3 IMPLANT
SLEEVE SCD KNEE MEDIUM (Patient Care Supply) ×3 IMPLANT
SOLUTION IRR POUR BTL 0.9% NS 1000ML (Non-Pharmacy Meds/Solutions) ×3 IMPLANT
SOLUTION IRR POUR BTL H20 1000ML (Non-Pharmacy Meds/Solutions) ×3 IMPLANT
SOLUTION IV 0.9% NS 1000ML (Non-Pharmacy Meds/Solutions) ×3 IMPLANT
SPONGE LAP RF DETECT 18" X 18" XRAY STERILE (Sponges) ×12 IMPLANT
STAPLER PROXIMATE SKIN 35 WIDE (Staplers and staple reloads) ×3 IMPLANT
STRIP MEDI-STRIP SKIN CLOSURE 1/2 X 4" (Dressings/packing) ×2 IMPLANT
STRIP SKIN CLOSURE 1/2 X 4 (Dressings/packing) ×3
STRIP WOUND CLOSURE 1" X 5" (Dressings/packing) ×6 IMPLANT
SUPPORT MAMMARY LG (External orthotic collars/braces/supports) IMPLANT
SUPPORT MAMMARY MEDIUM (External orthotic collars/braces/supports) ×3 IMPLANT
SUPPORT MAMMARY XL (External orthotic collars/braces/supports) IMPLANT
SUPPORT MAMMARY XXL (External orthotic collars/braces/supports) ×3 IMPLANT
SURGICAL PACK SAME DAY SET-UP (Procedure Packs/kits) ×6 IMPLANT
SUTURE ETHILON 2-0 18" FS (Suture) ×9 IMPLANT
SUTURE MONOCRYL PLUS 3-0 27" PS-2 (MCP427) (Suture) ×12 IMPLANT
SUTURE PDS PLUS 0 27" CT-2 (PDP334) (Suture) ×3 IMPLANT
SUTURE PDS PLUS 1 36" CT-1 (Suture) ×9 IMPLANT
SUTURE PERMA-HAND SILK 2-0 18" FS 685H (Suture) ×3 IMPLANT
SUTURE PLAIN GUT 5-0 18" PC-1 1915, FAST ABSORB (Suture) ×12 IMPLANT
SUTURE VICRYL PLUS 2-0 27" LF CP-2 (Suture) ×3 IMPLANT
SYRINGE 10CC LL CONTRL (Needles/punch/cannula/biopsy) ×6 IMPLANT
SYRINGE HYPO LL 20CC (Needles/punch/cannula/biopsy) IMPLANT
SYRINGE IRRG 50CC BULB (Misc Medical Supply) ×3 IMPLANT
TOWELS OR BLUE 4-PACK STERILE, DISPOSABLE (Drapes/towels) ×6 IMPLANT
YANKAUER BULB TIP ON/OFF CONTROL STERILE (Tubing/Suction) ×3 IMPLANT

## 2021-09-05 NOTE — Anesthesia Postprocedure Evaluation (Signed)
Anesthesia Post Note    Patient: Lynn Tran    Procedure(s) Performed: Procedure(s):  Right Breast Revision with breast implant exchange,  excision of nipple areolar complex with placement of alloderm      Final anesthesia type: General    Patient location: PACU    Post anesthesia pain: adequate analgesia    Mental status: awake and sedated    Airway Patent: Yes    Last Vitals:   Vitals Value Taken Time   BP 148/75 09/05/21 1205   Temp 36 C 09/05/21 1156   Pulse 77 09/05/21 1210   Resp 14 09/05/21 1210   SpO2 99 % 09/05/21 1210   Vitals shown include unvalidated device data.     Post vital signs: stable    Hydration: adequate    N/V:no    Anesthetic complications: no    Plan of care per primary team.

## 2021-09-05 NOTE — Telephone Encounter (Signed)
Please schedule the new patient with Dr. Karlyne Greenspan on Thursday 4/13. Thank you!

## 2021-09-05 NOTE — Telephone Encounter (Signed)
Spoke with the patient made appt

## 2021-09-05 NOTE — Telephone Encounter (Signed)
URGENT    Pt states she had an eye exam at [facility] yesterday where they saw blood in her retina.    Pt only in town until 5/1.    No vision changes    pls c/b.

## 2021-09-05 NOTE — Anesthesia Procedure Notes (Signed)
Regional Anesthesia Block Procedure Note  Date & Time: 09/05/2021 8:48 AM    Short Procedural Summary (Full description of each separate nerve block  below):  Procedure #1 -  Thoracic Paravertebral.    Laterality - Right.    Block type - Single injection(s).                          Medications Administrations:  Medications Administered at:  09/05/2021 8:48 AM    Indications  Procedure performed to address pain in the Chest wall/Breast/Axilla.  Laterality:  Right  Patient seen and examined in the PreOp area.  This procedure was performed at the request of the referring physician for post operative pain control.  Referred by attending surgeon        Universal protocol  Verbal consent obtained, written consent obtained, risks and benefits discussed, patient states understanding of the procedure being performed, the patient's understanding of the procedure matches consent given, procedure consent matches procedure scheduled, relevant documents present and verified, test results available and properly labeled, site marked, imaging studies available, required blood products, implants, devices, and special equipment available and Immediately prior to procedure a time out was called to verify the correct patient, procedure, equipment, support staff and site/side marked as required  Consent given by: patient  Patient identity confirmed by: verbally with patient, arm band, provided demographic data and hospital-assigned identification number  H&P: H&P Reviewed  Vital signs monitored: Yes  Sterile skin prep: Choloraprep (Chorahexadine)  O2 administered: Face Mask    Patient Instructions        Block Procedure #1  Block procedure type: Thoracic Paravertebral  Procedure laterality: Right  Levels: T 3-4  Nerve block:  Single injection(s)    Needle gauge used to perform procedure: Touhy and 22g  Block technique: Ultrasound guided  Ultrasound guidance used to identify relevant anatomy.  Ultrasound guidance used to visualize local  anesthetic spread around nerve(s).  Ultrasound guidance used to guide needle to targeted nerve and avoid vascular puncture.      Brief medication summary  IV sedation as documented on MAR. See MAR for full medication details  Local anesthetic medication: Ropivacaine    Local anesthetic volume: 10 mL  Complications: None  Procedure #1 comments: I, Nelida Meuse, MD, was present for the entire procedure.    Nelida Meuse, MD  09/05/2021 9:27 AM    Patient seen and examined in pre-op area, discussed risks, benefits, and alternatives of block procedure including but not limited to bleeding/infection, vascular injury, nerve damage, local anesthetic toxicity, and injury to nearby structures.  More specifically, the risk of nerve injury/damage was discussed in detail as this is a well recognized complication of nerve blocks (as well as a complication of surgery itself i.e. surgical trauma, tourniquet use, positioning, postoperative movement/manipulation, etc.).  Patient elected to proceed with block after understanding and accepting risks. Answered all questions, informed consent obtained. Patient tolerated procedure well without complications.           Block Procedure #2    Block Procedure #3    Block Procedure #4    Block Procedure #5    Block Procedure #6

## 2021-09-05 NOTE — Brief Op Note (Signed)
BRIEF OPERATIVE NOTE     DATE: 09/05/2021  TIME: 12:08 PM    PREOPERATIVE DIAGNOSIS: hx breast cancer, breast asymmetry    POSTOPERATIVE DIAGNOSIS: same    PROCEDURE, WOUND CLASSIFICATION & WOUND CLOSURE:   Procedure(s):  Right Breast Revision with breast implant exchange, - Wound Class: Class I (Clean) - Incision Closure: Deep and Superficial Layers  excision of nipple areolar complex with placement of alloderm - Wound Class: Class I (Clean) - Incision Closure: Deep and Superficial Layers    SURGEONS:  Primary: Cyndee Brightly, MD  Physician Assistant: Julianne Handler, [place]    ANESTHESIA:   Anesthesiologist: Earnestine Mealing, MD     OR STAFF  Circulator: Dizon, Cheryle Horsfall, RN; Christella Noa, RN  Scrub: Bobbe Medico, Lorin Glass    FINDINGS: right implant exchanged    SPECIMENS:  ID Type Source Tests Collected by Time Destination   A : Right Breast Mastectomy Scar Tissue Breast, Right PATHOLOGY TISSUE EXAM Cyndee Brightly, MD 09/05/2021 1014    B : Removed Right Breast Implant Implant Breast, Right PATHOLOGY TISSUE EXAM Cyndee Brightly, MD 09/05/2021 1037    C : RIGHT NIPPLE Tissue Breast, Right PATHOLOGY TISSUE EXAM Cyndee Brightly, MD 09/05/2021 1128        IMPLANTS:   Implant Name Type Inv. Item Serial No. Manufacturer Lot No. LRB No. Used Action   ALLODERM 6 X 16 THICK - SN/A Skin substitutes/matrix ALLODERM 6 X 16 THICK N/A [employer] [id number] Right 1 Implanted   BREAST IMPLANT  INSPIRA SRX 580CC - [id number] Tissue Expanders/breast implants BREAST IMPLANT  INSPIRA SRX 580CC [id number] [employer] [id number] Right 1 Implanted       Fluids/Blood Products:    IV Fluids: 700cc    Blood Products: none    EBL: 10cc    Urine Output: none    COMPLICATIONS: none    DISPOSITION: stable, to PACU then d/c home       Doyce Loose, [place] 09/05/2021   12:08 PM  pager [phone]          Event Time In   In [place] [facility]   Pre Procedure Start 0702   PreOp Nurse Complete 0736   Pre Procedure Tasks  Complete 0851   Room Setup Start 0815   Room Ready for Patient 0850   In Room 0927   Incision 1012   Closing Started 1113   Closing Complete 1144   Out of Room [place] Start 1157   Room Cleanup End 1203   In PACU 1156   PACU Criteria Complete    In Recovery    Recovery Criteria Complete    Ready For Visitors    Anesthesia Start [phone]   Anesthesia Ready [id number]   Anesthesia Stop    Epidural to C-Section    Regional Anesthesia Start    Regional Anesthesia Stop    Regional Block Administered

## 2021-09-05 NOTE — Discharge Instructions (Signed)
Post-operative Instructions Plastic Surgery    You may experience any of the following after an operation:  Soreness from incision.  A sore throat if you were put to sleep and a breathing tube was placed in your throat.  Sleepiness from anesthesia or other drugs given to you prior to or during your surgery.  Fatigue just from having surgery.  Nausea occurs sometimes, but depends largely on your reaction to surgery and drugs.    These discomforts should improve rapidly and are usually gone the day following surgery.     Medications:  OK to take tylenol for pain relief. Take as directed on the bottle.   After three days from surgery, OK to take ibuprofen as needed for pain. Take as directed on the bottle.     Prescriptions sent to your preferred pharmacy:   - Pain medication. Please take as directed on bottle.       Pain pills can cause drowsiness and sleepiness.        DO NOT mix with other sedatives, like sleep medications, anxiety medications, or alcohol.       DO NOT drive while taking pain medications.   - Constipation medication. Please take as directed on the bottle. Pain medications can cause constipation. This medicine should help prevent severe constipation.       Please call: [phone] if you go more than 3 days without a bowel movement.  - We may have sent you home with antibiotics. If so, please follow directions on the bottle.        If any rash appears please STOP IMMEDIATELY and call our clinic.        If problems breathing, or swelling in mouth/tongue/lips please STOP IMMEDIATELY and SEEK IMMEDIATE CARE.          *If you are taking tamoxifen, you may restart 7 days (1 week) after your surgery.     Dressing and/or incision care:  Leave your post-op garment and dressings in place until your post-op appt.     If you have a JP Drain in place:   - Empty drains and record amount twice a day.        We will remove the drains when there is less than 20-30cc of fluid for two days in a row. This can be up to  14 days after surgery date.         You may have to 'strip' the drain tubing to move the drainage to the bulb and prevent clots.   - Do NOT place any water or a skewer inside the bulb. It is a sterile reservoir.   - Drains should not need to be emptied more than twice a day.   - If your drain bulb is filling up quickly and needs to be emptied more than twice a day, please call the after hours line and request to speak with Dr. Juleen China directly.     To empty your JP Drain:   1. Wash your hands with soap and water for 30 seconds (sing the alphabet song from A to Z)  2. With the cap facing up to the sky, turn your JP-Drain bulb upside down   3. Read and record the volume and color of fluid inside the JP-Drain bulb  4. Open the cap of the JP-Drain bulb without touching the inside of the cap   5. Empty fluid into the toilet and flush  6. Compress the JP-Drain bulb and place cap back on  7. Wash your hands with soap and water    Bathing:  Do not remove your dressings after surgery.  Leave your post-op garment and dressings in place until your post-op appt.  You may clean your non surgery areas with soapy washcloth and pat dry.     Diet:    Progress to your normal diet as tolerated.   You may experience mild nausea after anesthesia. Some people prefer light meals or soup for the first 24 hours after surgery.      Activity:  No heavy lifting for 4-6 weeks. Nothing more than 10 pounds, which is about a gallon of milk.  Avoid strenuous activities for at least one month, or until cleared by surgery team.  Avoid driving or operating machinery while you taking pain medication    Return Appointment:  Future Appointments   Date Time Provider [place]   09/08/2021 10:00 AM Arneta Cliche, PA [facility] Shirl Harris           Any Questions and/or Symptoms please call your Nurse Case Manager:   Oscar La, NCM @ [phone]  Maisie Fus, NCM @ [phone]  Amparo Bristol RN @ [phone]  Call your if you experience  any of the following symptoms:   -Fever of 100.70F (oral temperature)   -Redness of the skin at incision site   -Drainage   -Increased tenderness at the surgical site   -Cloudy, foul-smelling drainage     For any symptoms that occur during the weekend or after 5pm Monday through Friday, please call the [facility] Operator @ [phone] or [phone] and inform the operator that your surgeon, Dr. Lawernce Keas, needs to be directly contacted for the post-surgical symptoms that you are experiencing. Please do not ask for the "on call" team.

## 2021-09-05 NOTE — Plan of Care (Signed)
Problem: Promotion of Perioperative Health and Safety  Goal: Promotion of Health and Safety of the Perioperative Patient  Description: The patient remains safe, receives treatment appropriate to the surgical intervention and patient's physiological needs and is discharged or transferred to the appropriate level of care.    Information below is the current care plan.  Outcome: Progressing  Flowsheets (Taken 09/05/2021 1312)  Patient /Family stated Goal: Discharge home  Guidelines: PACU  Individualized Interventions/Recommendations #1: Pain control

## 2021-09-06 ENCOUNTER — Telehealth (INDEPENDENT_AMBULATORY_CARE_PROVIDER_SITE_OTHER): Payer: Self-pay

## 2021-09-06 ENCOUNTER — Other Ambulatory Visit: Payer: Self-pay

## 2021-09-06 DIAGNOSIS — Z1501 Genetic susceptibility to malignant neoplasm of breast: Secondary | ICD-10-CM

## 2021-09-06 MED ORDER — AMOXICILLIN-POT CLAVULANATE 875-125 MG OR TABS
1.0000 | ORAL_TABLET | Freq: Two times a day (BID) | ORAL | 0 refills | Status: AC
Start: 2021-09-06 — End: ?

## 2021-09-06 NOTE — Addendum Note (Signed)
Addendum  created 09/06/21 1548 by Lorelle Gibbs, MD    Clinical Note Signed, Intraprocedure Event edited

## 2021-09-06 NOTE — Addendum Note (Signed)
Addended by: Doyce Loose on: 09/06/2021 01:35 PM     Modules accepted: Orders

## 2021-09-06 NOTE — Telephone Encounter (Signed)
Return phone call from pt voicemail to RN.  Pt reports localized rash with itching to chest/breast. Judeen Hammans PA notified and pt to stop Bactrim and new prescription for Augmentin to be sent to pharmacy for pickup. Verified pharmacy [facility] [address] [facility]. Pt denies shortness of breath/respiratory issues. Educated to call 911 or be seen by ER immediately if symptoms develop. Pt to take Benadryl for current symptoms.

## 2021-09-06 NOTE — Addendum Note (Signed)
Addendum  created 09/06/21 1404 by Lorelle Gibbs, MD    Pend clinical note

## 2021-09-06 NOTE — Telephone Encounter (Signed)
Patient called RN and left voicemail asking If she can remove some of the gauze from the left side of her bra as the tightness was uncomfortable.    Called pt back and pt states that dressing feels tight under the left breast. Pain decreased when pt adjusted the postioning of the bra. Instructed to keep bra and dressing in place until post op visit.

## 2021-09-06 NOTE — Telephone Encounter (Addendum)
Post op phone call pt assessment/pt education S/P Right Breast Revision with breast implant exchange and  excision of nipple areolar complex with placement of alloderm on 09/05/21    Called to check on patient postoperatively and ensure all is going well at home.      Spoke to patient.   Patient reports the following:  1) Pain: Norco every 8 hours. Marland Kitchen  2) Taking narcotics? yes  If so, using colace and drinking plenty of fluids? yes  3) Mobility: ambulatin  4) BMs: yes 09/05/21  5) Fever, malaise, purulent drainage, or other sx of infection: no  6) ROM: limited R  7) JPs: 1  JP output:   4/11 66m  PM  4/12 856m   AM  Pt was given Bactrim x7 days  8)Surgical bra: Reminded to take down after 72 hrs, take shower, let steri-strips get wet but do not remove, pat dry.     Patient reminded of the following:  -If you have a surgical vest/bra on, keep that on until you shower.  After 72 hours pt may switch to comfortable (not too tight) sports bra or other gentle support.  -Wait to shower until post-op appointment   -Keep all dressings on until post op visit.  However, if the clear dressing and gauze start to come off/get wet they should come off.  Do not replace the gauze, and do not peel steri-strips off.   -No heavy lifting/pushing/pulling (>10 lb) x 1 month.  -Reviewed infection precautions and advised pt to call ASAP with any new or concerning symptoms.    Pt confirms she has office and emergency contact info and will reach out with questions or concerns  NCM: [place] after hours concerns): [phone]  Patient has f/u appointment scheduled on 4/14 at 10 AM

## 2021-09-06 NOTE — Anesthesia Follow Up (Signed)
Regional Anesthesia Telephone Encounter Follow-Up Note  Peripheral Nerve Block    History of Present Illness:     Lynn Tran is a 66 year old female who is 1 Day Post-Op who had the following procedure done:    Right Breast Revision with breast implant exchange, - Wound Class: Class I (Clean) - Incision Closure: Deep and Superficial Layers  excision of nipple areolar complex with placement of alloderm - Wound Class: Class I (Clean) - Incision Closure: Deep and Superficial Layers    The patient had a right paravertebral peripheral nerve block placed for post-operative pain/intraoperative anesthesia.      Patient reports: Pain is well controlled.  Nerve block site is clean and dry without erythema, induration or tenderness to palpation.  Sensory block: resolved.  Motor block: none.    Assessment:  This is a 66 year old  female  who had a regional nerve block that has resolved.    Plan:   No further intervention required.   Patient advised to call [phone] and page the regional anesthesia fellow/resident on-call with any questions or concerns.

## 2021-09-06 NOTE — Op Note (Signed)
DATE OF SERVICE:  09/05/2021    PREOPERATIVE DIAGNOSIS:    1. 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   2. Past history of right breast carcinoma.    POSTOPERATIVE DIAGNOSIS:    1. 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   2. Past history of right breast carcinoma.    PROCEDURE PERFORMED:    1. Reconstruction of right breast with removal of previously placed  615 cc implant extensive periprosthetic capsulotomy, complex  recreation of inframammary fold with double-layer capsulorrhaphy.   2. Placement of ready to use 16 x 3 cm AlloDerm for reenforcement of  inframammary fold.   3. Replacement with downsized 580 cc SRX Allergan silicone gel  implant.     SURGEON/STAFF:  Cyndee Brightly, MD    ASSISTANT:  Julianne Handler, PA    ANESTHESIA:  General endotracheal.    INDICATIONS FOR SURGERY:  This patient came to me last year with  gross asymmetry and breast deformity secondary to right breast  cancer.  At that time, we did a left breast reduction and we removed  most of her capsule on the right side and relieved a great deal of  capsular contracture.  She did very well from that, but the  inframammary fold dropped too far because the scarred, thickened  encapsulation that she had was no longer present.  She is also  slightly too large.  So, we predicted that she would need 1 more  surgery and she is here today for this.  Doyce Loose, [place], assisted  with the surgery because no qualified resident was available.     PROCEDURE IN DETAIL:  The patient was brought to the operating room,  placed in the supine position, placed under general endotracheal  anesthesia.  Her bilateral breasts were prepped and draped in a  sterile manner.  We started with an incision on the right breast that  had been there prior and that was excised along its length down  through skin and subcutaneous tissue.  The  mastectomy flap was then  elevated down to the base of the implant that was sitting about 3 cm  too low below a good placed inframammary fold.  So, at this point,  she had a very thin capsule there.  We imbricated the capsule and did  capsulorrhaphy down to the new position about 3 cm higher and that  was done by utilizing #1 PDS sutures anchored to layer right over the  ribcage.  This was done in a double-layer and significantly raised  pocket to where we wanted to be.  We then opened the pocket and took  out the prior 615 cc implant.  We then did capsulotomy superiorly and  radially throughout.  We then brought a 580 cc SRX Sizer to the field  and placed it into the pocket and a better shape and form were seen.  The inferior capsule was so thin, I was concerned she would rip  through this and so we brought a piece of ready to use AlloDerm to  the field.  We soaked it in saline for 15 minutes.  We cut it  lengthwise in half to make 16 x 3 cm.  We then anchored that to the  inframammary fold, and to the remaining superior capsule with 2-0  Vicryl.  Just before the entire piece was closed to the capsule, we  took out the [employer].  We irrigated with Betadine and inspected for  small bleeding and then used the Keller funnel to insert the  permanent silicone gel implant.  The remaining AlloDerm was then  closed with Vicryl to the remaining capsule.  So, she essentially had  a two-layer inframammary fold recreation and then an additional layer  of the AlloDerm to secure that new inframammary fold creation.  The  wound was thoroughly irrigated, small bleeding controlled using the  Bovie.  Because we had elevated flaps over this, we did need to place  a 7 mm Jackson-Pratt drain through a separate stab incision and sewed  that in with 2-0 nylon suture.  The skin was then easily closed with  3-0 and 4-0 Monocryl deep dermal sutures.  The wound was then dressed  with Steri-Strips, Xeroform, and she was placed in a bra.   She  tolerated procedure well and returned to recovery room stable and  extubated.       Job #:  [id number]

## 2021-09-07 ENCOUNTER — Ambulatory Visit (INDEPENDENT_AMBULATORY_CARE_PROVIDER_SITE_OTHER): Payer: Medicare Other | Admitting: Cornea and External Diseases Specialist

## 2021-09-07 DIAGNOSIS — H35463 Secondary vitreoretinal degeneration, bilateral: Secondary | ICD-10-CM

## 2021-09-07 NOTE — Progress Notes (Signed)
I saw and examined Lynn Tran today on 09/07/2021 who is a 66 year old year old female patient.    HPI     Eye Exam    In both eyes.           Comments    CC: Pt states  that she had an eye exam at [facility] on Monday. ( 09/04/2021) and she was informed about an eye bleed in her OS.   HPI: Pt reports she had Breast surgery 2 days ago (09/05/2021) Had an allergic reaction to Bactrim.      No flashes or floaters  No diabetes, no hypertension    Ocular gtts  Lumify OU Ats prn          Last edited by Elberta Fortis, MD on 09/07/2021  2:55 PM.        Visual Acuity (ETDRS)       Right Left    Dist cc 20/20 20/25 +1    Correction: Glasses        Tonometry ([facility], 2:37 PM)       Right Left    Pressure 9 12        Main Ophthalmology Exam     External Exam       Right Left    External Normal Normal          Slit Lamp Exam       Right Left    Lids/Lashes Normal for Age Normal for Age    Conjunctiva/Sclera White and quiet White and quiet    Cornea Clear Clear    Anterior Chamber Deep and quiet Deep and quiet    Iris Round, no rubeosis Round, no rubeosis    Lens Clear Clear    Anterior Vitreous Clear Clear          Fundus Exam       Right Left    Disc Healthy contour and size Healthy contour and size    Macula Normal contour and reflex for age Normal contour and reflex for age    Vessels Normal Normal    Periphery Degeneration in far periphery Degeneration far peirphery              Ophthalmology Exam     Visual Fields       Right Left     Full Full          Edited by: Elberta Fortis, MD            Fundus Photos - OU - Both Eyes -Optos        Color Fundus Interpretation  Right Eye  Color progression has not been established as this is the first image. Disc findings include normal observations. Macula findings include normal observations. Vessel findings include normal observations. Periphery findings include degeneration.     Left Eye  Color progression has not been established as this is the first image. Disc  findings include normal observations. Macula findings include normal observations. Vessel findings include normal observations. Periphery findings include degeneration.     Autofluorescence Interpretation  Right Eye  Findings include Normal autofluorescence.     Left Eye  Findings include Normal autofluorescence.     Notes   Unless otherwise specified above, the interpretation can be found in today's or the subsequent clinic encounter note. For disc photos, the images were acquired to establish a baseline for future examinations.          OCT, Retina - OU -  Both Eyes -        Interpretation  Right Eye  The finding is: Normal without fluid. Normal Foveal Contour. Findings include Negative for PVD.     Left Eye  The finding is: Normal without fluid. Normal Foveal Contour. Findings include Negative for PVD.     Interval Change  Right Eye  Interval change is: Initial.     Left Eye  Interval change is: Initial.     Plan  Right Eye  Plan: Observe.     Left Eye  Plan: Observe.     Notes   Unless otherwise specified above, the interpretation can be found in today's or the subsequent clinic encounter note. For disc photos, the images were acquired to establish a baseline for future examinations.                   Assessment/Plan  1. Bilateral peripheral degeneration - noted supratemporal blot hemorrrhage on optos at optometrist,  reassured, Patient warned to be vigilant for symptoms of retinal detachment. These include new flashes, new floaters or new visual field loss. If these are noted, the patient should make arrangements to contact the department urgently or if out of hours to attend the emergency department for further review. Patient heading back home to [facility], happy to return in one year.      Follow up: 1 year    If you have any questions, please do not hesitate to contact me.      Sincerely,      Edmonia James  MBBS MRCP([place]) MRCSEd FRCOphth PhD  Assistant Professor  Retinal Division [place]

## 2021-09-08 ENCOUNTER — Ambulatory Visit: Payer: Medicare Other | Attending: Surgical | Admitting: Surgical

## 2021-09-08 VITALS — BP 116/71 | HR 93 | Temp 97.5°F | Resp 16 | Ht 69.0 in

## 2021-09-08 DIAGNOSIS — Z853 Personal history of malignant neoplasm of breast: Secondary | ICD-10-CM | POA: Insufficient documentation

## 2021-09-08 NOTE — Interdisciplinary (Signed)
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, S/P Lumpectomy and XRT at age 66, Reoccurrence at age 53 S/P Mastectomy with reconand adjuvant chemo- offered endocrine therapy but patient declined    H/O:poorly differentiated carcinosarcoma of the uterus at age 23, s/p TAH/BSO- followed by radiation    H/O: 2020 Rectal cancers/p robotic assisted transanal excision of previous polypectomy site within distal rectum    BRCA 1- recent testing    5/17/2022Left breast free nipple graft technique wise pattern breast reduction,complex right breast revision with capsulectomy, readadvancement of pectoralis muscle, implant removal and placement of silicone implant for symmetry, and lipo of axillary tail    09/05/21- Reconstruction of right breast with implant exchange, excision of NAC, and placement of alloderm    Assessment:  Spoke to patient Wednesday with c/o rash, changed antibiotics from Bactrim to Augmentin.     Examined by PA Jonna.  This RN did not see patient.     Drain in place, bloody, 20 cc a day.     Medications and allergies updated and reviewed in EPIC    Plan:    -RTC Monday

## 2021-09-08 NOTE — Goals of Care (Signed)
Advance Care Planning        Who would make medical decisions for the patient if they are unable to make decisions for themselves?   spouse    Based on above information I recommended the following:  reviewing [email or web address]    Total time spent face-to-face with patient and/or surrogate decision maker providing counseling related to advance care planning:   1 minutes

## 2021-09-08 NOTE — Patient Instructions (Signed)
[place] PHONE LIST FOR PATIENTS  Hours of operation: Monday-Friday 8:00 - 5:00pm, Closed Holidays and Weekends      AFTER HOURS EMERGENCY NUMBER: [phone] Ask for On-Call Surgeon for Surgical Symptoms. As for [facility] Oncologist for Medical Symptoms     Admin Assistant: Wynelle Fanny for Dr. Lawernce Keas: [phone]     Admin Assistant: Candis Musa for Nurse Practitioner [place] [phone]    Nurse Case Managers for Dr. Lawernce Keas and Nurse Practitioner Charna Archer: Chinita Pester., RN @ [phone]  Diona Browner., RN @ [phone]  Gaynelle Arabian., RN [phone]    Social Worker: Margaretmary Eddy, LCSW Phone: [phone] or Ellwood Dense, LCSW, OSW-C Phone [phone]    Please note that due to recent changes in state laws, there may be times when your imaging reports are being released through [facility] before your provider team is able to review them. Please know we will review them by next business day and will contact you with results and recommendations.      ___________________________________________________________________   Information Desk: [phone]   General information, directions, phone numbers, registration and other available services     Financial Counselors: [phone] at [facility] [phone] or [phone]     FOR INFORMATION REGARDING ADVANCE DIRECTIVES PLEASE VISIT   [email or web address]  ______________________________________________________________________  Tell us How We Did During Your Consultation/Follow Up Visit...  Your feedback goes a long way and makes a difference. If you would like to provide [place] feedback on how we did via telephone or  Email  E-mail: [email or web address] Phone: [phone]      Thank you for the opportunity to care for you during this time.

## 2021-09-08 NOTE — Progress Notes (Signed)
Post-Op Follow-up  Medical Record Number: [id number]  Date: 09/08/2021  DOB: Aug 23, 1955  Reason for visit: Surgical Follow-up      History of Present Illness: Lynn Tran is a 66 year old female with a history of R breast cancer, s/p R breast revision with implant exchange, excision on NAC and placement of alloderm on 09/05/21. Presents today for post-op follow-up.    Pain well managed with norco PRN. Had BM since surgery, taking colace. Taking antibiotics as prescribed. Developed rash from bactrim, so was switched to augmentin.  Good UE ROM. Denies fever/chills, SOB or LE swelling.  Happy with revision thus far. emptying drain BID, output 18-74m/day, serosanguinous.    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  amoxicillin-clavulanate (AUGMENTIN) 875-125 MG tablet, Take 1 tablet by mouth 2 times daily.  Biotin 1 MG CAPS,   budesonide-formoterol (SYMBICORT) 80-4.5 MCG/ACT inhaler, inhale 2 puffs by mouth twice a day  Cholecalciferol (VITAMIN D3) 1000 units tablet, 4,000 Int'l Units.  clobetasol propionate (TEMOVATE) 0.05 % cream, Apply 1 Application topically 2 times daily. Apply to affected area twice a day for recurrent contact dermatitis.  Coenzyme Q10 (CO Q 10 PO),   COLLAGEN PO,   diclofenac (VOLTAREN) 1 % gel, Apply 4 g topically 4 times daily. Use the plastic dose guide found inside the box to measure the  dose.  docusate sodium (COLACE) 250 MG capsule, Take 1 capsule (250 mg) by mouth 2 times daily.  docusate sodium (COLACE) 250 MG capsule, Take 1 capsule (250 mg) by mouth 2 times daily.  Glucosamine-MSM-Hyaluronic Acd (JOINT HEALTH PO),   HYDROcodone-acetaminophen (NORCO) 5-325 MG tablet, Take 1 tablet by mouth every 6 hours as needed for Moderate Pain (Pain Score 4-6).  ibuprofen (MOTRIN) 800 MG tablet, Take 1 tablet (800 mg) by mouth every 8 hours as needed for Mild Pain (Pain Score 1-3). Refilled require visit.  Milk Thistle 250 MG CAPS,   omeprazole (PRILOSEC) 20 MG capsule, Take 1 capsule (20 mg) by mouth daily.        Allergies:  Allergies   Allergen Reactions   . Adhesive Tape Rash   . Dmdm Hydantoin Hives   . Neosporin [Bacitracin-Polymyxin B] Rash   . Ancef [Cefazolin] Other     Delayed urticaria; significant uncertainly as may have been due to concurrently administered opioid   . Bactrim [Sulfamethoxazole W-Trimethoprim] Rash   . Codeine Nausea and Vomiting   . Morphine Nausea and Vomiting   . [Other] Other     Positive patch testing to:  Mercapto mix  quaternium 15  Imidazolidinyl urea  Neomycin  parthenolide  formaldehyde       Past Medical History:  Patient  has a past medical history of Asthma, BRCA gene mutation positive in female (2020), Breast cancer (CMS-HCC) (1984), Ectopic pregnancy, Lymphedema of right arm, Molar pregnancy, PONV (postoperative nausea and vomiting), Rectal cancer (CMS-HCC) (06/2018), Screening for cervical cancer (09/28/2018),  and Uterine cancer (CMS-HCC).    Past Surgical History:  The patient  has a past surgical history that includes Tonsillectomy and Adenoidectomy; H/o ectopic pregnancy; h/o  S/p robotic assisted transanal excision of previous polypectomy site within the distal rectum (05/2018); Total abdominal hysterectomy w/ bilateral salpingoophorectomy (1998); Breast lumpectomy (Right, 1984); and Mastectomy (Right, 1995).    Social History:  The patient  reports that she  has never smoked. She has never used smokeless tobacco. She reports current alcohol use of about 14.0 standard drinks of alcohol per week. She reports that she does not use drugs.    Family History:  Family History   Problem Relation Name Age of Onset   . Breast Cancer Mother  22        Recurrence 88   . Melanoma Cancer Mother  61   . Heart Attack Father  65   . Breast Cancer Paternal Grandfather          Diagnosed in 48s   . Colon Cancer Maternal Uncle          67s   . Other Maternal Uncle          Cushing's   . Breast Cancer Paternal Aunt          84s       Physical Exam:  BP 116/71 (BP Location: Left arm, BP Patient Position: Sitting, BP cuff size: Large)   Pulse 93   Temp 97.5 F (36.4 C) (Temporal)   Resp 16   Ht _0  (1.753 m)   SpO2 95%   BMI 29.46 kg/m    General: Alert and oriented, well-appearing  Psych: Normal speech and thought processes; affect normal  HEENT: Sclerae anicteric  Resp: Breathing comfortably, speaking full sentences, O2 sat normal  Cardiovascular: no LE edema  Breasts: R breast with steris over incisons, no erythema. Implant nicely in place. Drain in place with ss fluid present.  Musculoskeletal: Good UE ROM  Neuro: Mental status normal, gait normal    Assessment/Plan:      ICD-10-CM ICD-9-CM   1. History of breast cancer  Z85.3 V10.3       Recovering well. No signs or sx of infection.    - Continue Norco PRN for post-op pain; recommend addition of ibuprofen  - Continue abx  - Continue stool softener for opioid induced constipation  - OK to shower  - Refrain from exercise or lifting greater than 10# for 6 weeks or cleared by Dr. Juleen China.    RTC 4/17 for probable drain removal and breast check with Dr. Juleen China as she will be leaving back to [facility] 5/1.    The supervising physician responsible for the care of this patient is Dr. Juleen China.    Desma Paganini, PA-C  Comprehensive Breast Health Team

## 2021-09-11 ENCOUNTER — Ambulatory Visit: Payer: Medicare Other | Attending: Surgical | Admitting: Surgical

## 2021-09-11 VITALS — BP 133/80 | HR 83 | Temp 98.1°F | Resp 16 | Ht 69.0 in

## 2021-09-11 DIAGNOSIS — Z853 Personal history of malignant neoplasm of breast: Secondary | ICD-10-CM | POA: Insufficient documentation

## 2021-09-11 DIAGNOSIS — Z09 Encounter for follow-up examination after completed treatment for conditions other than malignant neoplasm: Secondary | ICD-10-CM

## 2021-09-11 NOTE — Patient Instructions (Signed)
[place] PHONE LIST FOR PATIENTS  Hours of operation: Monday-Friday 8:00 - 5:00pm, Closed Holidays and Weekends      AFTER HOURS EMERGENCY NUMBER: [phone] Ask for On-Call Surgeon for Surgical Symptoms. As for [facility] Oncologist for Medical Symptoms     Admin Assistant: Wynelle Fanny for Dr. Lawernce Keas: [phone]     Admin Assistant: Candis Musa for Nurse Practitioner [place] [phone]    Nurse Case Managers for Dr. Lawernce Keas and Nurse Practitioner Charna Archer: Chinita Pester., RN @ [phone]  Diona Browner., RN @ [phone]  Gaynelle Arabian., RN [phone]    Social Worker: Margaretmary Eddy, LCSW Phone: [phone] or Ellwood Dense, LCSW, OSW-C Phone [phone]    Please note that due to recent changes in state laws, there may be times when your imaging reports are being released through [facility] before your provider team is able to review them. Please know we will review them by next business day and will contact you with results and recommendations.      ___________________________________________________________________   Information Desk: [phone]   General information, directions, phone numbers, registration and other available services     Financial Counselors: [phone] at [facility] [phone] or [phone]     FOR INFORMATION REGARDING ADVANCE DIRECTIVES PLEASE VISIT   [email or web address]  ______________________________________________________________________  Tell us How We Did During Your Consultation/Follow Up Visit...  Your feedback goes a long way and makes a difference. If you would like to provide [place] feedback on how we did via telephone or  Email  E-mail: [email or web address] Phone: [phone]      Thank you for the opportunity to care for you during this time.

## 2021-09-11 NOTE — Progress Notes (Signed)
Post-Op Follow-up  Medical Record Number: [id number]  Date: 09/11/2021  DOB: Oct 11, 1955  Reason for visit: Surgical Follow-up      History of Present Illness: Lynn Tran is a 66 year old female with a history of R breast cancer, s/p R breast revision with implant exchange, excision on NAC and placement of alloderm on 09/05/21. Presents today for post-op follow-up and drain removal.    Pain well managed with ibuprofen PRN.  Taking antibiotics as prescribed.  Good UE ROM. Denies fever/chills, or erythema. Emptying drain BID, output 7-56m/day, serosanguinous.    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  amoxicillin-clavulanate (AUGMENTIN) 875-125 MG tablet, Take 1 tablet by mouth 2 times daily.  Biotin 1 MG CAPS,   budesonide-formoterol (SYMBICORT) 80-4.5 MCG/ACT inhaler, inhale 2 puffs by mouth twice a day  Cholecalciferol (VITAMIN D3) 1000 units tablet, 4,000 Int'l Units.  clobetasol propionate (TEMOVATE) 0.05 % cream, Apply 1 Application topically 2 times daily. Apply to affected area twice a day for recurrent contact dermatitis.  Coenzyme Q10 (CO Q 10 PO),   COLLAGEN PO,   diclofenac (VOLTAREN) 1 % gel, Apply 4 g topically 4 times daily. Use the plastic dose guide found inside the box to measure the dose.  docusate sodium (COLACE) 250 MG capsule, Take 1 capsule (250 mg) by mouth 2 times daily.  Glucosamine-MSM-Hyaluronic  Acd (JOINT HEALTH PO),   ibuprofen (MOTRIN) 800 MG tablet, Take 1 tablet (800 mg) by mouth every 8 hours as needed for Mild Pain (Pain Score 1-3). Refilled require visit.  Milk Thistle 250 MG CAPS,   omeprazole (PRILOSEC) 20 MG capsule, Take 1 capsule (20 mg) by mouth daily.        Allergies:  Allergies   Allergen Reactions   . Adhesive Tape Rash   . Dmdm Hydantoin Hives   . Neosporin [Bacitracin-Polymyxin B] Rash   . Ancef [Cefazolin] Other     Delayed urticaria; significant uncertainly as may have been due to concurrently administered opioid   . Bactrim [Sulfamethoxazole W-Trimethoprim] Rash   . Codeine Nausea and Vomiting   . Morphine Nausea and Vomiting   . [Other] Other     Positive patch testing to:  Mercapto mix  quaternium 15  Imidazolidinyl urea  Neomycin  parthenolide  formaldehyde       Past Medical History:  Patient  has a past medical history of Asthma, BRCA gene mutation positive in female (2020), Breast cancer (CMS-HCC) (1984), Ectopic pregnancy, Lymphedema of right arm, Molar pregnancy, PONV (postoperative nausea and vomiting), Rectal cancer (CMS-HCC) (06/2018), Screening for cervical cancer (09/28/2018), and Uterine cancer (CMS-HCC).    Past Surgical History:  The patient  has a past surgical history that includes Tonsillectomy and Adenoidectomy; H/o ectopic pregnancy; h/o  S/p robotic assisted transanal excision of previous polypectomy site within the distal rectum (05/2018); Total abdominal hysterectomy w/ bilateral salpingoophorectomy (1998); Breast lumpectomy (Right, 1984); and Mastectomy (Right, 1995).  Social History:  The patient  reports that she has never smoked. She has never used smokeless tobacco. She reports current alcohol use of about 14.0 standard drinks of alcohol per week. She reports that she does not use drugs.    Family History:  Family History   Problem Relation Name Age of Onset   . Breast Cancer Mother  24        Recurrence 10   . Melanoma Cancer Mother  41   . Heart Attack  Father  21   . Breast Cancer Paternal Grandfather          Diagnosed in 103s   . Colon Cancer Maternal Uncle          57s   . Other Maternal Uncle          Cushing's   . Breast Cancer Paternal Aunt          36s       Physical Exam:  BP 133/80 (BP Location: Left arm, BP Patient Position: Sitting, BP cuff size: Regular)   Pulse 83   Temp 98.1 F (36.7 C) (Temporal)   Resp 16   Ht _0  (1.753 m)   SpO2 97%   BMI 29.46 kg/m    General: Alert and oriented, well-appearing  Psych: Normal speech and thought processes; affect normal  HEENT: Sclerae anicteric  Resp: Breathing comfortably, speaking full sentences, O2 sat normal  Cardiovascular: no LE edema  Breasts: R breast with steris over incisons, no erythema. Implant nicely in place. Drain in place with ss fluid present, removed today.  Musculoskeletal: Good UE ROM  Neuro: Mental status normal, gait normal    Assessment/Plan:      ICD-10-CM ICD-9-CM   1. History of breast cancer  Z85.3 V10.3       Recovering well. No signs or sx of infection.    Drain appropriate for removal. Drain from R breast removed. Serosanguinous fluid in drain. Drain tip intact, no erythema, frank pus. Abx ointment and bandage applied. Instructed no shower x 24 hours and take dressing off after shower. May use OTC abx ointment and bandaid at home if needed. Advised return to clinic or call immediately if erythema, warmth, tenderness to drain site, or if systemic fever greater than 100.32F. Pt verbalizes good understanding and agrees to the above. Pt observed for a short time with no complications.     - Continue ibuprofen PRN for post-op pain  - Continue abx  - Refrain from exercise or lifting greater than 10# for 6 weeks or cleared by Dr. Juleen China.  - Avoid breast massage until 8 wks post-op    RTC PRN as she is moving to [facility] 5/1.    All questions answered   The patient was seen and examined by Dr. Juleen China today.  Treatment plan established by physician, who was immediately  available for today's visit.     Desma Paganini, PA-C  Comprehensive Breast Health Team

## 2021-09-19 ENCOUNTER — Other Ambulatory Visit: Payer: Self-pay

## 2021-09-19 ENCOUNTER — Telehealth (HOSPITAL_BASED_OUTPATIENT_CLINIC_OR_DEPARTMENT_OTHER): Payer: Self-pay

## 2021-09-19 NOTE — Telephone Encounter (Signed)
Received a VM from patient stating that she plans to return to [place] on Sunday and would like to have drain site evaluated prior to her departure. She describes the site as reddened but otherwise healing well.

## 2021-09-19 NOTE — Telephone Encounter (Signed)
Return call.   Patient desribes the site as "bumped up and red". She thinks triple antibiotics ointment may have been applied at the time of her drain removal and she is allergic to topicals with neosporin. Her boyfriend assessed site and removed something black, they suspect it was residual nylon suture or scab. Since removing that and applying her cream for contact dermatitis the area has been less irritable.   Will roue to front desk to add on for 11:30 RN visit tomorrow

## 2021-09-20 ENCOUNTER — Ambulatory Visit (HOSPITAL_BASED_OUTPATIENT_CLINIC_OR_DEPARTMENT_OTHER): Payer: Medicare Other

## 2021-09-20 ENCOUNTER — Telehealth (HOSPITAL_BASED_OUTPATIENT_CLINIC_OR_DEPARTMENT_OTHER): Payer: Self-pay

## 2021-09-20 NOTE — Telephone Encounter (Signed)
Patient called in stating she thinks the drain site is much better after her BF was able to pull out some nylon suture with tweezers yesterday. Redness has calmed down, and the area is now flat. She does not think she needs to be seen today and wants her nurse visit cancelled.      Routed to AA to cancel.

## 2021-12-26 ENCOUNTER — Encounter (HOSPITAL_BASED_OUTPATIENT_CLINIC_OR_DEPARTMENT_OTHER): Payer: Self-pay | Admitting: Plastic Surgery

## 2022-06-10 ENCOUNTER — Encounter: Payer: Self-pay | Admitting: Hospital

## 2022-07-30 ENCOUNTER — Ambulatory Visit (INDEPENDENT_AMBULATORY_CARE_PROVIDER_SITE_OTHER): Payer: Medicare Other | Admitting: Cornea and External Diseases Specialist

## 2022-07-30 DIAGNOSIS — H35463 Secondary vitreoretinal degeneration, bilateral: Secondary | ICD-10-CM

## 2022-07-30 MED ORDER — LUMIFY 0.025 % OP SOLN: OPHTHALMIC | Status: AC

## 2022-07-30 NOTE — Progress Notes (Signed)
I saw and examined Lynn Tran today on 07/30/2022 who is a 67 year old year old female patient.    HPI       Follow Up     Additional comments: Peripheral retinal degeneration, secondary vitreoretinal, bilateral               Comments    Peripheral retinal degeneration, secondary vitreoretinal, bilateral  CC: No vision changes OU.   Red in morning pt uses Lumify.  (+) tearing OU  No flashes or floaters OU            Last edited by Starling Manns on 07/30/2022 10:52 AM.        Visual Acuity (ETDRS)         Right Left    Dist cc 20/20 -2 20/20 -1      Correction: Glasses          Tonometry ([facility], 10:58 AM)         Right Left    Pressure 11 14          Main Ophthalmology Exam       External Exam         Right Left    External Normal Normal              Slit Lamp Exam         Right Left    Lids/Lashes Normal for Age Normal for Age    Conjunctiva/Sclera White and quiet White and quiet    Cornea Clear Clear    Anterior Chamber Deep and quiet Deep and quiet    Iris Round, no rubeosis Round, no rubeosis    Lens Clear Clear    Anterior Vitreous Clear Clear              Fundus Exam         Right Left    Disc Healthy contour and size Healthy contour and size    Macula Normal contour and reflex for age Normal contour and reflex for age    Vessels Normal Normal    Periphery Degeneration in far periphery Degeneration far peirphery                  Ophthalmology Exam       Visual Fields         Right Left     Full Full              Edited by: Elberta Fortis, MD              OCT, [place]          Interpretation  Right Eye  The finding is: Normal without fluid. Normal Foveal Contour. Findings include Negative for PVD.     Left Eye  The finding is: Normal without fluid. Normal Foveal Contour. Findings include Negative for PVD.     Interval Change  Right Eye  Interval change is: Stable.     Left Eye  Interval change is: Stable.     Plan  Right Eye  Plan: Observe.     Left Eye  Plan: Observe.      Notes  Unless otherwise specified above, the interpretation can be found in today's or the subsequent clinic encounter note. For disc photos, the images were acquired to establish a baseline for future examinations.           Fundus Photos - OU - Both  Eyes -Autofluorescence; Optos          Color Fundus Interpretation  Right Eye  Color progression has been stable. Disc findings include normal observations. Macula findings include normal observations. Vessel findings include normal observations. Periphery findings include degeneration.     Left Eye  Color progression has been stable. Disc findings include normal observations. Macula findings include normal observations. Vessel findings include normal observations. Periphery findings include degeneration.     Autofluorescence Interpretation  Right Eye  Findings include Normal autofluorescence.     Left Eye  Findings include Normal autofluorescence.     Notes  Unless otherwise specified above, the interpretation can be found in today's or the subsequent clinic encounter note. For disc photos, the images were acquired to establish a baseline for future examinations.                  Assessment/Plan  Bilateral peripheral degeneration - stable, reassured, noted supratemporal blot hemorrrhage on optos at optometrist,  reassured, Patient warned to be vigilant for symptoms of retinal detachment. These include new flashes, new floaters or new visual field loss. If these are noted, the patient should make arrangements to contact the department urgently or if out of hours to attend the emergency department for further review. Patient heading back home to [facility], happy to return in one year.  Dry eyes - To use over the counter artifical tears such as refresh or systane four times a day bilaterally        Follow up: 1 year    If you have any questions, please do not hesitate to contact me.      Sincerely,      Edmonia James  MBBS MRCP([place]) MRCSEd FRCOphth  PhD  Assistant Professor  Retinal Division [place]
